# Patient Record
Sex: Male | Born: 1935
Health system: Southern US, Community
[De-identification: ages and names within clinical notes are randomized; demographics above are authoritative.]

## PROBLEM LIST (undated history)

## (undated) DIAGNOSIS — K219 Gastro-esophageal reflux disease without esophagitis: Secondary | ICD-10-CM

## (undated) DIAGNOSIS — E785 Hyperlipidemia, unspecified: Secondary | ICD-10-CM

## (undated) HISTORY — DX: Gastro-esophageal reflux disease without esophagitis: K21.9

---
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
## 1998-06-11 ENCOUNTER — Emergency Department (HOSPITAL_COMMUNITY): Admission: EM | Admit: 1998-06-11 | Discharge: 1998-06-11 | Payer: Self-pay | Admitting: Emergency Medicine

## 2003-09-27 ENCOUNTER — Ambulatory Visit (HOSPITAL_COMMUNITY): Admission: RE | Admit: 2003-09-27 | Discharge: 2003-09-27 | Payer: Self-pay | Admitting: Gastroenterology

## 2011-04-05 ENCOUNTER — Other Ambulatory Visit: Payer: Self-pay | Admitting: Gastroenterology

## 2011-04-05 ENCOUNTER — Ambulatory Visit (HOSPITAL_COMMUNITY)
Admission: RE | Admit: 2011-04-05 | Discharge: 2011-04-05 | Disposition: A | Discharge status: Home / Self Care | Payer: Medicare Other | Source: Ambulatory Visit | Attending: Gastroenterology | Admitting: Gastroenterology

## 2011-04-05 DIAGNOSIS — R112 Nausea with vomiting, unspecified: Secondary | ICD-10-CM | POA: Insufficient documentation

## 2011-04-05 DIAGNOSIS — E538 Deficiency of other specified B group vitamins: Secondary | ICD-10-CM | POA: Insufficient documentation

## 2011-04-05 DIAGNOSIS — R209 Unspecified disturbances of skin sensation: Secondary | ICD-10-CM | POA: Insufficient documentation

## 2011-04-05 DIAGNOSIS — R634 Abnormal weight loss: Secondary | ICD-10-CM | POA: Insufficient documentation

## 2011-04-05 DIAGNOSIS — R630 Anorexia: Secondary | ICD-10-CM | POA: Insufficient documentation

## 2011-04-05 DIAGNOSIS — K2940 Chronic atrophic gastritis without bleeding: Secondary | ICD-10-CM | POA: Insufficient documentation

## 2011-04-12 NOTE — Op Note (Signed)
  NAME:  John Sosa, John Sosa               ACCOUNT NO.:  [id number]  MEDICAL RECORD NO.:  [id number]           PATIENT TYPE:  O  LOCATION:  [facility]                         FACILITY:  [facility]  PHYSICIAN:  Danise Edge, M.D.   DATE OF BIRTH:  Jan 21, 1929  DATE OF PROCEDURE: DATE OF DISCHARGE:                              OPERATIVE REPORT   PROCEDURE:  Diagnostic esophagogastroduodenoscopy.  REFERRING PHYSICIAN:  Theressa Millard, M.D.  HISTORY:  Mr. John Sosa is an 77 year old male, born 1929-02-07.  The patient has not felt well since October 2011.  He reports a loss in his appetite, nausea without vomiting, regurgitation, and weight loss.  He has a tingling sensation in his hands.  He develops small painful ulcers in his mouth that heal.  The patient occasionally takes Celebrex but does not take Excedrin frequently.  He had stomach ulcers when he was a child.  He reports no gastrointestinal bleeding.  He does experience early satiety.  The patient was recently diagnosed with vitamin B12 deficiency.  Esophagogastroduodenoscopy is performed to rule out gastric malignancy, H. pylori gastritis, and celiac disease.  ENDOSCOPIST:  Danise Edge, M.D.  PREMEDICATIONS:  Fentanyl 50 mcg, Versed 4 mg.  PROCEDURE IN DETAIL:  After obtaining informed consent, the patient was placed in the left lateral decubitus position.  The Pentax gastroscope was passed through the posterior hypopharynx into the proximal esophagus without difficulty.  The hypopharynx, larynx and vocal cords appeared normal.  Esophagoscopy:  The proximal mid and lower segments of the esophageal mucosa appear completely normal.  The squamocolumnar junction is noted at 37 cm from the incisor teeth.  There is no endoscopic evidence for the presence of erosive esophagitis or Barrett's esophagus.  Gastroscopy:  Retroflex view of the gastric cardia and fundus was normal.  The diaphragmatic hiatus is patulous.  The gastric  body, antrum and pylorus appear normal.  Biopsies were taken from the gastric antrum, gastric body, and gastric cardia.  Duodenoscopy:  The duodenal bulb and descending duodenum appeared normal.  Biopsies were taken from the second portion of the duodenum and the duodenal bulb.  ASSESSMENT:  Normal esophagogastroduodenoscopy.  Gastric biopsies to look for Helicobacter pylori gastritis and small bowel biopsies to look for villous atrophy are pending.          ______________________________ Danise Edge, M.D.     MJ/MEDQ  D:  04/05/2011  T:  04/05/2011  Job:  [id number]  Electronically Signed by Danise Edge M.D. on 04/12/2011 04:54:09 PM

## 2011-07-10 ENCOUNTER — Other Ambulatory Visit: Payer: Self-pay | Admitting: Gastroenterology

## 2012-01-11 DIAGNOSIS — H3530 Unspecified macular degeneration: Secondary | ICD-10-CM | POA: Diagnosis not present

## 2012-01-11 DIAGNOSIS — H52209 Unspecified astigmatism, unspecified eye: Secondary | ICD-10-CM | POA: Diagnosis not present

## 2012-01-11 DIAGNOSIS — H53009 Unspecified amblyopia, unspecified eye: Secondary | ICD-10-CM | POA: Diagnosis not present

## 2012-01-11 DIAGNOSIS — H43819 Vitreous degeneration, unspecified eye: Secondary | ICD-10-CM | POA: Diagnosis not present

## 2012-04-09 DIAGNOSIS — Z79899 Other long term (current) drug therapy: Secondary | ICD-10-CM | POA: Diagnosis not present

## 2012-04-09 DIAGNOSIS — Z0000 Encounter for general adult medical examination without abnormal findings: Secondary | ICD-10-CM | POA: Diagnosis not present

## 2012-04-09 DIAGNOSIS — E7800 Pure hypercholesterolemia, unspecified: Secondary | ICD-10-CM | POA: Diagnosis not present

## 2012-04-09 DIAGNOSIS — Z1331 Encounter for screening for depression: Secondary | ICD-10-CM | POA: Diagnosis not present

## 2012-04-22 DIAGNOSIS — J029 Acute pharyngitis, unspecified: Secondary | ICD-10-CM | POA: Diagnosis not present

## 2012-06-06 DIAGNOSIS — R972 Elevated prostate specific antigen [PSA]: Secondary | ICD-10-CM | POA: Diagnosis not present

## 2012-06-06 DIAGNOSIS — N529 Male erectile dysfunction, unspecified: Secondary | ICD-10-CM | POA: Diagnosis not present

## 2012-06-06 DIAGNOSIS — N400 Enlarged prostate without lower urinary tract symptoms: Secondary | ICD-10-CM | POA: Diagnosis not present

## 2012-06-25 DIAGNOSIS — E7800 Pure hypercholesterolemia, unspecified: Secondary | ICD-10-CM | POA: Diagnosis not present

## 2012-06-25 DIAGNOSIS — Z79899 Other long term (current) drug therapy: Secondary | ICD-10-CM | POA: Diagnosis not present

## 2012-10-25 DIAGNOSIS — Z23 Encounter for immunization: Secondary | ICD-10-CM | POA: Diagnosis not present

## 2013-01-20 DIAGNOSIS — Z961 Presence of intraocular lens: Secondary | ICD-10-CM | POA: Diagnosis not present

## 2013-01-20 DIAGNOSIS — H43819 Vitreous degeneration, unspecified eye: Secondary | ICD-10-CM | POA: Diagnosis not present

## 2013-01-20 DIAGNOSIS — H53009 Unspecified amblyopia, unspecified eye: Secondary | ICD-10-CM | POA: Diagnosis not present

## 2013-01-20 DIAGNOSIS — H3530 Unspecified macular degeneration: Secondary | ICD-10-CM | POA: Diagnosis not present

## 2013-04-13 DIAGNOSIS — Z79899 Other long term (current) drug therapy: Secondary | ICD-10-CM | POA: Diagnosis not present

## 2013-04-13 DIAGNOSIS — E538 Deficiency of other specified B group vitamins: Secondary | ICD-10-CM | POA: Diagnosis not present

## 2013-04-13 DIAGNOSIS — E7800 Pure hypercholesterolemia, unspecified: Secondary | ICD-10-CM | POA: Diagnosis not present

## 2013-04-13 DIAGNOSIS — Z0000 Encounter for general adult medical examination without abnormal findings: Secondary | ICD-10-CM | POA: Diagnosis not present

## 2013-04-13 DIAGNOSIS — Z1331 Encounter for screening for depression: Secondary | ICD-10-CM | POA: Diagnosis not present

## 2013-04-13 DIAGNOSIS — G4733 Obstructive sleep apnea (adult) (pediatric): Secondary | ICD-10-CM | POA: Diagnosis not present

## 2013-04-13 DIAGNOSIS — G43009 Migraine without aura, not intractable, without status migrainosus: Secondary | ICD-10-CM | POA: Diagnosis not present

## 2013-04-13 DIAGNOSIS — Z86010 Personal history of colonic polyps: Secondary | ICD-10-CM | POA: Diagnosis not present

## 2013-04-20 DIAGNOSIS — T148 Other injury of unspecified body region: Secondary | ICD-10-CM | POA: Diagnosis not present

## 2013-06-12 DIAGNOSIS — R972 Elevated prostate specific antigen [PSA]: Secondary | ICD-10-CM | POA: Diagnosis not present

## 2013-06-12 DIAGNOSIS — N400 Enlarged prostate without lower urinary tract symptoms: Secondary | ICD-10-CM | POA: Diagnosis not present

## 2013-08-06 DIAGNOSIS — Z23 Encounter for immunization: Secondary | ICD-10-CM | POA: Diagnosis not present

## 2013-11-10 DIAGNOSIS — R1032 Left lower quadrant pain: Secondary | ICD-10-CM | POA: Diagnosis not present

## 2013-11-19 DIAGNOSIS — J069 Acute upper respiratory infection, unspecified: Secondary | ICD-10-CM | POA: Diagnosis not present

## 2013-11-19 DIAGNOSIS — J209 Acute bronchitis, unspecified: Secondary | ICD-10-CM | POA: Diagnosis not present

## 2013-11-20 ENCOUNTER — Emergency Department (HOSPITAL_COMMUNITY)
Admission: EM | Admit: 2013-11-20 | Discharge: 2013-11-20 | Disposition: A | Discharge status: Home / Self Care | Payer: Medicare Other | Attending: Emergency Medicine | Admitting: Emergency Medicine

## 2013-11-20 ENCOUNTER — Encounter (HOSPITAL_COMMUNITY): Payer: Self-pay | Admitting: Emergency Medicine

## 2013-11-20 ENCOUNTER — Emergency Department (HOSPITAL_COMMUNITY): Payer: Medicare Other

## 2013-11-20 DIAGNOSIS — E785 Hyperlipidemia, unspecified: Secondary | ICD-10-CM | POA: Insufficient documentation

## 2013-11-20 DIAGNOSIS — R059 Cough, unspecified: Secondary | ICD-10-CM | POA: Insufficient documentation

## 2013-11-20 DIAGNOSIS — Z87891 Personal history of nicotine dependence: Secondary | ICD-10-CM | POA: Insufficient documentation

## 2013-11-20 DIAGNOSIS — R0789 Other chest pain: Secondary | ICD-10-CM | POA: Insufficient documentation

## 2013-11-20 DIAGNOSIS — Z79899 Other long term (current) drug therapy: Secondary | ICD-10-CM | POA: Diagnosis not present

## 2013-11-20 DIAGNOSIS — R079 Chest pain, unspecified: Secondary | ICD-10-CM

## 2013-11-20 DIAGNOSIS — R05 Cough: Secondary | ICD-10-CM | POA: Insufficient documentation

## 2013-11-20 DIAGNOSIS — R5381 Other malaise: Secondary | ICD-10-CM | POA: Insufficient documentation

## 2013-11-20 DIAGNOSIS — R5383 Other fatigue: Secondary | ICD-10-CM

## 2013-11-20 HISTORY — DX: Hyperlipidemia, unspecified: E78.5

## 2013-11-20 LAB — CBC
HCT: 39.1 % (ref 39.0–52.0)
Hemoglobin: 13.3 g/dL (ref 13.0–17.0)
MCH: 29.1 pg (ref 26.0–34.0)
MCHC: 34 g/dL (ref 30.0–36.0)
MCV: 85.6 fL (ref 78.0–100.0)
Platelets: 123 10*3/uL — ABNORMAL LOW (ref 150–400)
RBC: 4.57 MIL/uL (ref 4.22–5.81)
RDW: 13.1 % (ref 11.5–15.5)
WBC: 5.2 10*3/uL (ref 4.0–10.5)

## 2013-11-20 LAB — BASIC METABOLIC PANEL
BUN: 13 mg/dL (ref 6–23)
CALCIUM: 8.9 mg/dL (ref 8.4–10.5)
CO2: 23 mEq/L (ref 19–32)
Chloride: 101 mEq/L (ref 96–112)
Creatinine, Ser: 0.8 mg/dL (ref 0.50–1.35)
GFR, EST NON AFRICAN AMERICAN: 81 mL/min — AB (ref >90)
GLUCOSE: 98 mg/dL (ref 70–99)
Potassium: 4 mEq/L (ref 3.7–5.3)
Sodium: 138 mEq/L (ref 137–147)

## 2013-11-20 LAB — POCT I-STAT TROPONIN I
TROPONIN I, POC: 0 ng/mL (ref 0.00–0.08)
Troponin i, poc: 0 ng/mL (ref 0.00–0.08)
Troponin i, poc: 0 ng/mL (ref 0.00–0.08)

## 2013-11-20 LAB — POCT I-STAT, CHEM 8
BUN: 18 mg/dL (ref 6–23)
CHLORIDE: 105 meq/L (ref 96–112)
Calcium, Ion: 1.22 mmol/L (ref 1.13–1.30)
Creatinine, Ser: 0.8 mg/dL (ref 0.50–1.35)
GLUCOSE: 80 mg/dL (ref 70–99)
HEMATOCRIT: 39 % (ref 39.0–52.0)
HEMOGLOBIN: 13.3 g/dL (ref 13.0–17.0)
Potassium: 4.3 mEq/L (ref 3.7–5.3)
Sodium: 142 mEq/L (ref 137–147)
TCO2: 26 mmol/L (ref 0–100)

## 2013-11-20 MED ORDER — ACETAMINOPHEN 325 MG PO TABS
650.0000 mg | ORAL_TABLET | Freq: Once | ORAL | Status: AC
  Administered 2013-11-20: 650 mg via ORAL
  Filled 2013-11-20: qty 2

## 2013-11-20 MED ORDER — DEXTROMETHORPHAN-GUAIFENESIN 10-200 MG/5ML PO LIQD: 10.0000 mL | Freq: Four times a day (QID) | ORAL | Status: DC | PRN

## 2013-11-20 MED ORDER — TRAMADOL HCL 50 MG PO TABS: 50.0000 mg | ORAL_TABLET | Freq: Four times a day (QID) | ORAL | Status: DC | PRN

## 2013-11-20 NOTE — Discharge Instructions (Signed)
Chest Pain (Nonspecific) °It is often hard to give a specific diagnosis for the cause of chest pain. There is always a chance that your pain could be related to something serious, such as a heart attack or a blood clot in the lungs. You need to follow up with your caregiver for further evaluation. °CAUSES  °· Heartburn. °· Pneumonia or bronchitis. °· Anxiety or stress. °· Inflammation around your heart (pericarditis) or lung (pleuritis or pleurisy). °· A blood clot in the lung. °· A collapsed lung (pneumothorax). It can develop suddenly on its own (spontaneous pneumothorax) or from injury (trauma) to the chest. °· Shingles infection (herpes zoster virus). °The chest wall is composed of bones, muscles, and cartilage. Any of these can be the source of the pain. °· The bones can be bruised by injury. °· The muscles or cartilage can be strained by coughing or overwork. °· The cartilage can be affected by inflammation and become sore (costochondritis). °DIAGNOSIS  °Lab tests or other studies, such as X-rays, electrocardiography, stress testing, or cardiac imaging, may be needed to find the cause of your pain.  °TREATMENT  °· Treatment depends on what may be causing your chest pain. Treatment may include: °· Acid blockers for heartburn. °· Anti-inflammatory medicine. °· Pain medicine for inflammatory conditions. °· Antibiotics if an infection is present. °· You may be advised to change lifestyle habits. This includes stopping smoking and avoiding alcohol, caffeine, and chocolate. °· You may be advised to keep your head raised (elevated) when sleeping. This reduces the chance of acid going backward from your stomach into your esophagus. °· Most of the time, nonspecific chest pain will improve within 2 to 3 days with rest and mild pain medicine. °HOME CARE INSTRUCTIONS  °· If antibiotics were prescribed, take your antibiotics as directed. Finish them even if you start to feel better. °· For the next few days, avoid physical  activities that bring on chest pain. Continue physical activities as directed. °· Do not smoke. °· Avoid drinking alcohol. °· Only take over-the-counter or prescription medicine for pain, discomfort, or fever as directed by your caregiver. °· Follow your caregiver's suggestions for further testing if your chest pain does not go away. °· Keep any follow-up appointments you made. If you do not go to an appointment, you could develop lasting (chronic) problems with pain. If there is any problem keeping an appointment, you must call to reschedule. °SEEK MEDICAL CARE IF:  °· You think you are having problems from the medicine you are taking. Read your medicine instructions carefully. °· Your chest pain does not go away, even after treatment. °· You develop a rash with blisters on your chest. °SEEK IMMEDIATE MEDICAL CARE IF:  °· You have increased chest pain or pain that spreads to your arm, neck, jaw, back, or abdomen. °· You develop shortness of breath, an increasing cough, or you are coughing up blood. °· You have severe back or abdominal pain, feel nauseous, or vomit. °· You develop severe weakness, fainting, or chills. °· You have a fever. °THIS IS AN EMERGENCY. Do not wait to see if the pain will go away. Get medical help at once. Call your local emergency services (911 in [place] Do not drive yourself to the hospital. °MAKE SURE YOU:  °· Understand these instructions. °· Will watch your condition. °· Will get help right away if you are not doing well or get worse. °Document Released: 08/08/2005 Document Revised: 01/21/2012 Document Reviewed: 06/03/2008 °Exit[employer]® Patient Information ©2014 [employer]   [employer]. ° °

## 2013-11-20 NOTE — ED Notes (Signed)
Discharge instructions reviewed with pt and family. Pt verbalized understanding.  

## 2013-11-20 NOTE — ED Notes (Signed)
Pt returned from radiology.

## 2013-11-20 NOTE — ED Provider Notes (Signed)
CSN: [id number]     Arrival date & time 11/20/13  0350 History   First MD Initiated Contact with Patient 11/20/13 [phone]     Chief Complaint  Patient presents with  . Chest Pain   (Consider location/radiation/quality/duration/timing/severity/associated sxs/prior Treatment) Patient is a 80 y.o. male presenting with chest pain. The history is provided by the patient.  Chest Pain Associated symptoms: cough and fatigue   Associated symptoms: no abdominal pain, no back pain, no headache, no nausea, no numbness, no shortness of breath, not vomiting and no weakness    patient with pain in both arms today. He states it feels as if it is in his bones. He states he's had a cough and "the flu" over the last few days. He states he's had episodes of chest tightness. He's had a cough with some sputum production. No fevers. He states he's felt fatigued. He is a former smoker. No abdominal pain. No lightheadedness. He's had some fatigue. No trauma. No diaphoresis. No nausea.  Past Medical History  Diagnosis Date  . Hyperlipemia    History reviewed. No pertinent past surgical history. No family history on file. History  Substance Use Topics  . Smoking status: Former Research scientist (life sciences)  . Smokeless tobacco: Not on file  . Alcohol Use: No    Review of Systems  Constitutional: Positive for fatigue. Negative for activity change and appetite change.  Eyes: Negative for pain.  Respiratory: Positive for cough. Negative for chest tightness and shortness of breath.   Cardiovascular: Positive for chest pain. Negative for leg swelling.  Gastrointestinal: Negative for nausea, vomiting, abdominal pain and diarrhea.  Genitourinary: Negative for flank pain.  Musculoskeletal: Negative for back pain and neck stiffness.  Skin: Negative for rash.  Neurological: Negative for weakness, numbness and headaches.  Psychiatric/Behavioral: Negative for behavioral problems.    Allergies  Review of patient's allergies indicates no  known allergies.  Home Medications   Current Outpatient Rx  Name  Route  Sig  Dispense  Refill  . Cyanocobalamin (VITAMIN B-12 IJ)   Injection   Inject 1 mL as directed every 30 (thirty) days.         . Dextromethorphan-Guaifenesin (MUCINEX FAST-MAX DM MAX) 5-100 MG/5ML LIQD   Oral   Take 20 mLs by mouth every 6 (six) hours as needed (cough).         . Multiple Vitamins-Minerals (ICAPS MV) TABS   Oral   Take 1 tablet by mouth daily.         . simvastatin (ZOCOR) 10 MG tablet   Oral   Take 10 mg by mouth daily.         Marland Kitchen sulfamethoxazole-trimethoprim (BACTRIM DS) 800-160 MG per tablet   Oral   Take 1 tablet by mouth 2 (two) times daily.         Marland Kitchen Dextromethorphan-Guaifenesin (TUSSIN DM MAX) 10-200 MG/5ML LIQD   Oral   Take 10 mLs by mouth every 6 (six) hours as needed (cough).   118 mL   0   . traMADol (ULTRAM) 50 MG tablet   Oral   Take 1 tablet (50 mg total) by mouth every 6 (six) hours as needed.   15 tablet   0    BP 133/84  Pulse 54  Temp(Src) 98.7 F (37.1 C) (Oral)  Resp 12  SpO2 99% Physical Exam  Nursing note and vitals reviewed. Constitutional: He is oriented to person, place, and time. He appears well-developed and well-nourished.  HENT:  Head: Normocephalic and  atraumatic.  Eyes: EOM are normal. Pupils are equal, round, and reactive to light.  Neck: Normal range of motion. Neck supple.  Cardiovascular: Normal rate, regular rhythm and normal heart sounds.   No murmur heard. Pulmonary/Chest: Effort normal and breath sounds normal.  Abdominal: Soft. Bowel sounds are normal. He exhibits no distension and no mass. There is no tenderness. There is no rebound and no guarding.  Musculoskeletal: Normal range of motion. He exhibits no edema.  Neurological: He is alert and oriented to person, place, and time. No cranial nerve deficit.  Skin: Skin is warm and dry.  Psychiatric: He has a normal mood and affect.    ED Course  Procedures (including  critical care time) Labs Review Labs Reviewed  CBC - Abnormal; Notable for the following:    Platelets 123 (*)    All other components within normal limits  BASIC METABOLIC PANEL - Abnormal; Notable for the following:    GFR calc non Af Amer 81 (*)    All other components within normal limits  POCT I-STAT, CHEM 8  POCT I-STAT TROPONIN I  POCT I-STAT TROPONIN I  POCT I-STAT TROPONIN I   Imaging Review Dg Chest 2 View  11/20/2013   CLINICAL DATA:  Chest tightness, pain  EXAM: CHEST  2 VIEW  COMPARISON:  None.  FINDINGS: The lungs are hyperinflated likely secondary to COPD. There is no focal parenchymal opacity, pleural effusion, or pneumothorax. The heart and mediastinal contours are unremarkable.  There is a patchy sclerotic lesion in the proximal left humeral metaphysis with a chondroid matrix most consistent with a benign chondroid lesion such as an enchondroma.  IMPRESSION: No active cardiopulmonary disease.   Electronically Signed   By: Kathreen Devoid   On: 11/20/2013 09:42    EKG Interpretation    Date/Time:  Friday November 20 2013 08:56:19 EST Ventricular Rate:  54 PR Interval:  160 QRS Duration: 94 QT Interval:  425 QTC Calculation: 403 R Axis:   46 Text Interpretation:  Sinus rhythm Baseline wander in lead(s) V5 Confirmed by Tymika Grilli  MD, Shiro Ellerman ([id number]) on 11/20/2013 12:23:05 PM            MDM   1. Chest pain    Patient with chest pain. EKG reassuring. Enzymes negative x2. X-ray reassuring. Nonfocal exam. Discharge home    Jasper Riling. Alvino Chapel, MD 11/20/13 [phone]

## 2013-11-20 NOTE — ED Notes (Signed)
Dr Pickering at bedside 

## 2013-11-20 NOTE — ED Notes (Signed)
Per EMS - pt c/o chest tightness X 5 days, yesterday morning the tightness was worse, along with bilateral arm pain. Pt rating pain at 2/10. Recent chest congestion, lung sounds clear. 12 lead NSR. Pt took 325 mg of ASA PTA. ems started 18 G IV and placed on 2 liters [place]. BP 178/79. HR 61.

## 2013-11-24 DIAGNOSIS — M2550 Pain in unspecified joint: Secondary | ICD-10-CM | POA: Diagnosis not present

## 2013-11-24 DIAGNOSIS — J069 Acute upper respiratory infection, unspecified: Secondary | ICD-10-CM | POA: Diagnosis not present

## 2013-11-24 DIAGNOSIS — IMO0001 Reserved for inherently not codable concepts without codable children: Secondary | ICD-10-CM | POA: Diagnosis not present

## 2014-04-14 DIAGNOSIS — Z79899 Other long term (current) drug therapy: Secondary | ICD-10-CM | POA: Diagnosis not present

## 2014-04-14 DIAGNOSIS — Z23 Encounter for immunization: Secondary | ICD-10-CM | POA: Diagnosis not present

## 2014-04-14 DIAGNOSIS — J309 Allergic rhinitis, unspecified: Secondary | ICD-10-CM | POA: Diagnosis not present

## 2014-04-14 DIAGNOSIS — Z86010 Personal history of colonic polyps: Secondary | ICD-10-CM | POA: Diagnosis not present

## 2014-04-14 DIAGNOSIS — Z0000 Encounter for general adult medical examination without abnormal findings: Secondary | ICD-10-CM | POA: Diagnosis not present

## 2014-04-14 DIAGNOSIS — G43009 Migraine without aura, not intractable, without status migrainosus: Secondary | ICD-10-CM | POA: Diagnosis not present

## 2014-04-14 DIAGNOSIS — Z1331 Encounter for screening for depression: Secondary | ICD-10-CM | POA: Diagnosis not present

## 2014-04-14 DIAGNOSIS — E7800 Pure hypercholesterolemia, unspecified: Secondary | ICD-10-CM | POA: Diagnosis not present

## 2014-04-14 DIAGNOSIS — E538 Deficiency of other specified B group vitamins: Secondary | ICD-10-CM | POA: Diagnosis not present

## 2014-04-14 DIAGNOSIS — G4733 Obstructive sleep apnea (adult) (pediatric): Secondary | ICD-10-CM | POA: Diagnosis not present

## 2014-06-18 DIAGNOSIS — R972 Elevated prostate specific antigen [PSA]: Secondary | ICD-10-CM | POA: Diagnosis not present

## 2014-06-18 DIAGNOSIS — N400 Enlarged prostate without lower urinary tract symptoms: Secondary | ICD-10-CM | POA: Diagnosis not present

## 2014-06-18 DIAGNOSIS — N529 Male erectile dysfunction, unspecified: Secondary | ICD-10-CM | POA: Diagnosis not present

## 2014-07-14 ENCOUNTER — Ambulatory Visit
Admission: RE | Admit: 2014-07-14 | Discharge: 2014-07-14 | Disposition: A | Discharge status: Home / Self Care | Payer: Medicare Other | Source: Ambulatory Visit | Attending: Internal Medicine | Admitting: Internal Medicine

## 2014-07-14 ENCOUNTER — Other Ambulatory Visit: Payer: Self-pay | Admitting: Internal Medicine

## 2014-07-14 DIAGNOSIS — IMO0002 Reserved for concepts with insufficient information to code with codable children: Secondary | ICD-10-CM | POA: Diagnosis not present

## 2014-07-14 DIAGNOSIS — M533 Sacrococcygeal disorders, not elsewhere classified: Secondary | ICD-10-CM | POA: Diagnosis not present

## 2014-07-14 DIAGNOSIS — W1809XA Striking against other object with subsequent fall, initial encounter: Secondary | ICD-10-CM

## 2014-08-05 DIAGNOSIS — Z23 Encounter for immunization: Secondary | ICD-10-CM | POA: Diagnosis not present

## 2014-09-13 DIAGNOSIS — H43813 Vitreous degeneration, bilateral: Secondary | ICD-10-CM | POA: Diagnosis not present

## 2014-09-13 DIAGNOSIS — H3531 Nonexudative age-related macular degeneration: Secondary | ICD-10-CM | POA: Diagnosis not present

## 2014-09-13 DIAGNOSIS — H53001 Unspecified amblyopia, right eye: Secondary | ICD-10-CM | POA: Diagnosis not present

## 2014-09-13 DIAGNOSIS — H52203 Unspecified astigmatism, bilateral: Secondary | ICD-10-CM | POA: Diagnosis not present

## 2015-04-20 DIAGNOSIS — Z1389 Encounter for screening for other disorder: Secondary | ICD-10-CM | POA: Diagnosis not present

## 2015-04-20 DIAGNOSIS — E780 Pure hypercholesterolemia: Secondary | ICD-10-CM | POA: Diagnosis not present

## 2015-04-20 DIAGNOSIS — Z0000 Encounter for general adult medical examination without abnormal findings: Secondary | ICD-10-CM | POA: Diagnosis not present

## 2015-04-20 DIAGNOSIS — G4733 Obstructive sleep apnea (adult) (pediatric): Secondary | ICD-10-CM | POA: Diagnosis not present

## 2015-04-20 DIAGNOSIS — E538 Deficiency of other specified B group vitamins: Secondary | ICD-10-CM | POA: Diagnosis not present

## 2015-07-06 DIAGNOSIS — N400 Enlarged prostate without lower urinary tract symptoms: Secondary | ICD-10-CM | POA: Diagnosis not present

## 2015-07-06 DIAGNOSIS — R972 Elevated prostate specific antigen [PSA]: Secondary | ICD-10-CM | POA: Diagnosis not present

## 2015-07-13 ENCOUNTER — Encounter (HOSPITAL_COMMUNITY): Payer: Self-pay | Admitting: *Deleted

## 2015-07-13 ENCOUNTER — Emergency Department (HOSPITAL_COMMUNITY): Payer: Medicare Other

## 2015-07-13 ENCOUNTER — Observation Stay (HOSPITAL_COMMUNITY)
Admission: EM | Admit: 2015-07-13 | Discharge: 2015-07-14 | Disposition: A | Discharge status: Home / Self Care | Payer: Medicare Other | Attending: Internal Medicine | Admitting: Internal Medicine

## 2015-07-13 DIAGNOSIS — Z792 Long term (current) use of antibiotics: Secondary | ICD-10-CM | POA: Diagnosis not present

## 2015-07-13 DIAGNOSIS — R079 Chest pain, unspecified: Principal | ICD-10-CM | POA: Diagnosis present

## 2015-07-13 DIAGNOSIS — K219 Gastro-esophageal reflux disease without esophagitis: Secondary | ICD-10-CM | POA: Diagnosis present

## 2015-07-13 DIAGNOSIS — Z79899 Other long term (current) drug therapy: Secondary | ICD-10-CM | POA: Diagnosis not present

## 2015-07-13 DIAGNOSIS — R0789 Other chest pain: Secondary | ICD-10-CM | POA: Diagnosis not present

## 2015-07-13 DIAGNOSIS — J449 Chronic obstructive pulmonary disease, unspecified: Secondary | ICD-10-CM | POA: Diagnosis not present

## 2015-07-13 DIAGNOSIS — N400 Enlarged prostate without lower urinary tract symptoms: Secondary | ICD-10-CM | POA: Diagnosis present

## 2015-07-13 DIAGNOSIS — E785 Hyperlipidemia, unspecified: Secondary | ICD-10-CM | POA: Diagnosis not present

## 2015-07-13 DIAGNOSIS — R110 Nausea: Secondary | ICD-10-CM | POA: Diagnosis not present

## 2015-07-13 DIAGNOSIS — R001 Bradycardia, unspecified: Secondary | ICD-10-CM | POA: Diagnosis not present

## 2015-07-13 DIAGNOSIS — Z87891 Personal history of nicotine dependence: Secondary | ICD-10-CM | POA: Insufficient documentation

## 2015-07-13 HISTORY — DX: Benign prostatic hyperplasia without lower urinary tract symptoms: N40.0

## 2015-07-13 LAB — BASIC METABOLIC PANEL
Anion gap: 7 (ref 5–15)
BUN: 20 mg/dL (ref 6–20)
CO2: 24 mmol/L (ref 22–32)
CREATININE: 0.92 mg/dL (ref 0.61–1.24)
Calcium: 8.8 mg/dL — ABNORMAL LOW (ref 8.9–10.3)
Chloride: 108 mmol/L (ref 101–111)
GFR calc Af Amer: >60 mL/min (ref >60)
GFR calc non Af Amer: >60 mL/min (ref >60)
Glucose, Bld: 92 mg/dL (ref 65–99)
Potassium: 4.1 mmol/L (ref 3.5–5.1)
Sodium: 139 mmol/L (ref 135–145)

## 2015-07-13 LAB — CBC
HCT: 39.5 % (ref 39.0–52.0)
HEMOGLOBIN: 13.3 g/dL (ref 13.0–17.0)
MCH: 30 pg (ref 26.0–34.0)
MCHC: 33.7 g/dL (ref 30.0–36.0)
MCV: 89 fL (ref 78.0–100.0)
Platelets: 139 10*3/uL — ABNORMAL LOW (ref 150–400)
RBC: 4.44 MIL/uL (ref 4.22–5.81)
RDW: 13.1 % (ref 11.5–15.5)
WBC: 8.8 10*3/uL (ref 4.0–10.5)

## 2015-07-13 LAB — I-STAT TROPONIN, ED: TROPONIN I, POC: 0.01 ng/mL (ref 0.00–0.08)

## 2015-07-13 LAB — TROPONIN I: Troponin I: <0.03 ng/mL (ref <0.031)

## 2015-07-13 MED ORDER — GI COCKTAIL ~~LOC~~: 30.0000 mL | Freq: Four times a day (QID) | ORAL | Status: DC | PRN

## 2015-07-13 MED ORDER — MORPHINE SULFATE (PF) 2 MG/ML IV SOLN: 2.0000 mg | INTRAVENOUS | Status: DC | PRN

## 2015-07-13 MED ORDER — CELECOXIB 200 MG PO CAPS
200.0000 mg | ORAL_CAPSULE | Freq: Every day | ORAL | Status: DC | PRN
  Filled 2015-07-13: qty 1

## 2015-07-13 MED ORDER — FINASTERIDE 5 MG PO TABS
5.0000 mg | ORAL_TABLET | Freq: Every day | ORAL | Status: DC
  Administered 2015-07-13: 5 mg via ORAL
  Filled 2015-07-13: qty 1

## 2015-07-13 MED ORDER — ACETAMINOPHEN 325 MG PO TABS: 650.0000 mg | ORAL_TABLET | ORAL | Status: DC | PRN

## 2015-07-13 MED ORDER — ASPIRIN EC 325 MG PO TBEC
325.0000 mg | DELAYED_RELEASE_TABLET | Freq: Every day | ORAL | Status: DC
  Administered 2015-07-14: 325 mg via ORAL
  Filled 2015-07-13 (×2): qty 1

## 2015-07-13 MED ORDER — SIMVASTATIN 10 MG PO TABS: 10.0000 mg | ORAL_TABLET | Freq: Every day | ORAL | Status: DC

## 2015-07-13 MED ORDER — ONDANSETRON HCL 4 MG/2ML IJ SOLN: 4.0000 mg | Freq: Four times a day (QID) | INTRAMUSCULAR | Status: DC | PRN

## 2015-07-13 MED ORDER — VITAMIN D 1000 UNITS PO TABS
1000.0000 [IU] | ORAL_TABLET | Freq: Every day | ORAL | Status: DC
  Administered 2015-07-14: 1000 [IU] via ORAL
  Filled 2015-07-13: qty 1

## 2015-07-13 NOTE — ED Notes (Signed)
Pt reported chest pain and indigestion. Pt called EMS. Pt received mylanta, 350mg  asprin and 1 nitro. Pt states that the mylanta seemed to help. Pt was reported to have a HR of 48. Pt denies pain at this time.

## 2015-07-13 NOTE — H&P (Signed)
PCP:   Horton Finer, MD   Chief Complaint:  Chest pain  HPI: 81 yo male healthy lives at home with wife around 3pm today started having lefts sided chest pain that felt like his heartburn.  Pt reports his "heartburn" always is the left chest not epigastric, but this time it was more severe and lasted longer.  He took aspirin, his wifes ntg tablet and maalox.  His pain resolved by the time he arrived the ED.  Lasted about 30 minutes and was associated with nausea.  Has had stress testing done years ago, they dont remember when.  Wife provides some history also.  No recent illnesses.  No fevers, no cough or swelling in his legs.  He is referred for admission for evaluation of his chest pain.  Review of Systems:  Positive and negative as per HPI otherwise all other systems are negative  Past Medical History: Past Medical History  Diagnosis Date  . Hyperlipemia    History reviewed. No pertinent past surgical history.  Medications: Prior to Admission medications   Medication Sig Start Date End Date Taking? Authorizing Provider  celecoxib (CELEBREX) 200 MG capsule Take 200 mg by mouth daily as needed for mild pain or moderate pain.  06/08/15  Yes Historical Provider, MD  cholecalciferol (VITAMIN D) 1000 UNITS tablet Take 1,000 Units by mouth daily.   Yes Historical Provider, MD  Cyanocobalamin (VITAMIN B-12 IJ) Inject 1 mL as directed every 30 (thirty) days.   Yes Historical Provider, MD  finasteride (PROSCAR) 5 MG tablet Take 5 mg by mouth at bedtime. 07/06/15  Yes Historical Provider, MD  simvastatin (ZOCOR) 10 MG tablet Take 10 mg by mouth daily.   Yes Historical Provider, MD  Dextromethorphan-Guaifenesin (TUSSIN DM MAX) 10-200 MG/5ML LIQD Take 10 mLs by mouth every 6 (six) hours as needed (cough). Patient not taking: Reported on 07/13/2015 11/20/13   Davonna Belling, MD  traMADol (ULTRAM) 50 MG tablet Take 1 tablet (50 mg total) by mouth every 6 (six) hours as needed. Patient not  taking: Reported on 07/13/2015 11/20/13   Davonna Belling, MD    Allergies:  No Known Allergies  Social History:  reports that he has quit smoking. He does not have any smokeless tobacco history on file. He reports that he does not drink alcohol or use illicit drugs.  Family History: No premature CAD  Physical Exam: Filed Vitals:   07/13/15 1930 07/13/15 1945 07/13/15 2000 07/13/15 2015  BP: 144/78 146/88 157/79 145/63  Pulse: 58 49 51 47  Temp:      TempSrc:      Resp: 13 16 17 17   Height:      Weight:      SpO2: 91% 99% 99% 100%   General appearance: alert, cooperative and no distress Head: Normocephalic, without obvious abnormality, atraumatic Eyes: negative Nose: Nares normal. Septum midline. Mucosa normal. No drainage or sinus tenderness. Neck: no JVD and supple, symmetrical, trachea midline Lungs: clear to auscultation bilaterally Heart: regular rate and rhythm, S1, S2 normal, no murmur, click, rub or gallop Abdomen: soft, non-tender; bowel sounds normal; no masses,  no organomegaly Extremities: extremities normal, atraumatic, no cyanosis or edema Pulses: 2+ and symmetric Skin: Skin color, texture, turgor normal. No rashes or lesions Neurologic: Grossly normal  Labs on Admission:   Recent Labs  07/13/15 1712  NA 139  K 4.1  CL 108  CO2 24  GLUCOSE 92  BUN 20  CREATININE 0.92  CALCIUM 8.8*    Recent  Labs  07/13/15 1712  WBC 8.8  HGB 13.3  HCT 39.5  MCV 89.0  PLT 139*   ekg reveiwed sinus brady no ishchemic changes cxr rewieved and no edema or infiltrate Case discussed with jen ED provider   Radiological Exams on Admission: Dg Chest 2 View  07/13/2015   CLINICAL DATA:  Left-sided chest pain began this afternoon.  EXAM: CHEST  2 VIEW  COMPARISON:  11/20/2013  FINDINGS: The lungs are hyperinflated likely secondary to COPD. There is no focal parenchymal opacity. There is no pleural effusion or pneumothorax. The heart and mediastinal contours are  unremarkable.  The osseous structures are unremarkable.  IMPRESSION: No active cardiopulmonary disease.   Electronically Signed   By: Kathreen Devoid   On: 07/13/2015 17:37    Assessment/Plan  81 yo male with h/o HLD, GERD, BPH here with atypical chest pain  Principal Problem:   Chest pain-  Unclear etiology.  Sounds atypical for GERD and more concerning for cardiac etiology.  Chest pain free at this time.  obs on tele.  Serial cardiac enzymes and check cardiac echo in am.  His wife set him up to see her cardiologist on sept 8, they have appt for this already as outpatient.  Place on daily aspirin.  Pt instructed to let us know if pain recurs, if so consider adding ntg paste to regimen.  Initial trop neg and ekg nonischemic at this time.  Active Problems:   Hyperlipemia-  Cont home zocor   GERD (gastroesophageal reflux disease)- noted   BPH (benign prostatic hypertrophy)- cont home med   Sinus bradycardia on ECG- noted, mild in 50s.  Echo in am.  Tele monitoring.  Hold bblocker at this time  obs on tele.  Full code.    DAVID,RACHAL A 07/13/2015, 8:35 PM

## 2015-07-13 NOTE — ED Provider Notes (Signed)
CSN: [id number]     Arrival date & time 07/13/15  1702 History   First MD Initiated Contact with Patient 07/13/15 1918     Chief Complaint  Patient presents with  . Chest Pain     (Consider location/radiation/quality/duration/timing/severity/associated sxs/prior Treatment) HPI Comments: Patient is an 81 yo M PMHx significant for HLD presenting to the ED for evaluation of left sided chest pain that began at 3PM this afternoon. Per family he was pale and nauseated. The wife gave him one of her SL nitro and Mylanta with resolution of his pain. He has never had a stress test, echo, or cardiac cath. Due to see cardiology for first visit 07/22/15.   Patient is a 81 y.o. male presenting with chest pain. The history is provided by the patient and a relative.  Chest Pain Pain location:  L chest Pain quality: pressure and tightness   Pain radiates to:  Does not radiate Onset quality:  Sudden Duration:  15 minutes Progression:  Resolved Chronicity:  New Relieved by:  Nitroglycerin Worsened by:  Nothing tried Ineffective treatments:  None tried Associated symptoms: nausea   Associated symptoms: no abdominal pain and no shortness of breath   Risk factors: high cholesterol and male sex     Past Medical History  Diagnosis Date  . Hyperlipemia    History reviewed. No pertinent past surgical history. No family history on file. Social History  Substance Use Topics  . Smoking status: Former Research scientist (life sciences)  . Smokeless tobacco: None  . Alcohol Use: No    Review of Systems  Respiratory: Negative for shortness of breath.   Cardiovascular: Positive for chest pain.  Gastrointestinal: Positive for nausea. Negative for abdominal pain.  All other systems reviewed and are negative.     Allergies  Review of patient's allergies indicates no known allergies.  Home Medications   Prior to Admission medications   Medication Sig Start Date End Date Taking? Authorizing Provider  Cyanocobalamin (VITAMIN  B-12 IJ) Inject 1 mL as directed every 30 (thirty) days.    Historical Provider, MD  Dextromethorphan-Guaifenesin ([place] FAST-MAX DM MAX) 5-100 MG/5ML LIQD Take 20 mLs by mouth every 6 (six) hours as needed (cough).    Historical Provider, MD  Dextromethorphan-Guaifenesin (TUSSIN DM MAX) 10-200 MG/5ML LIQD Take 10 mLs by mouth every 6 (six) hours as needed (cough). 11/20/13   Davonna Belling, MD  Multiple Vitamins-Minerals (ICAPS MV) TABS Take 1 tablet by mouth daily.    Historical Provider, MD  simvastatin (ZOCOR) 10 MG tablet Take 10 mg by mouth daily.    Historical Provider, MD  sulfamethoxazole-trimethoprim (BACTRIM DS) 800-160 MG per tablet Take 1 tablet by mouth 2 (two) times daily.    Historical Provider, MD  traMADol (ULTRAM) 50 MG tablet Take 1 tablet (50 mg total) by mouth every 6 (six) hours as needed. 11/20/13   Davonna Belling, MD   BP 146/88 mmHg  Pulse 49  Temp(Src) 97.8 F (36.6 C) (Oral)  Resp 16  Ht 5\' 6"  (1.676 m)  Wt 140 lb (63.504 kg)  BMI 22.61 kg/m2  SpO2 99% Physical Exam  Constitutional: He is oriented to person, place, and time. He appears well-developed and well-nourished. No distress.  HENT:  Head: Normocephalic and atraumatic.  Right Ear: External ear normal.  Left Ear: External ear normal.  Nose: Nose normal.  Eyes: Conjunctivae are normal.  Neck: Neck supple.  Cardiovascular: Regular rhythm and normal heart sounds.  Bradycardia present.   Pulmonary/Chest: Effort normal and breath sounds  normal.  Abdominal: Soft. There is no tenderness.  Musculoskeletal: He exhibits no edema.  Neurological: He is alert and oriented to person, place, and time.  Skin: Skin is warm and dry. He is not diaphoretic.  Nursing note and vitals reviewed.   ED Course  Procedures (including critical care time) Medications - No data to display  Labs Review Labs Reviewed  BASIC METABOLIC PANEL - Abnormal; Notable for the following:    Calcium 8.8 (*)    All other components  within normal limits  CBC - Abnormal; Notable for the following:    Platelets 139 (*)    All other components within normal limits  I-STAT TROPOININ, ED    Imaging Review Dg Chest 2 View  07/13/2015   CLINICAL DATA:  Left-sided chest pain began this afternoon.  EXAM: CHEST  2 VIEW  COMPARISON:  11/20/2013  FINDINGS: The lungs are hyperinflated likely secondary to COPD. There is no focal parenchymal opacity. There is no pleural effusion or pneumothorax. The heart and mediastinal contours are unremarkable.  The osseous structures are unremarkable.  IMPRESSION: No active cardiopulmonary disease.   Electronically Signed   By: Kathreen Devoid   On: 07/13/2015 17:37   I have personally reviewed and evaluated these images and lab results as part of my medical decision-making.   EKG Interpretation None      MDM   Final diagnoses:  Acute chest pain    Filed Vitals:   07/13/15 1945  BP: 146/88  Pulse: 49  Temp:   Resp: 16   Afebrile, NAD, non-toxic appearing, AAOx4.   Concern for cardiac etiology of Chest Pain. Hospitalist has been consulted and will see patient in the ED for likely admit. Pt does not meet criteria for CP protocol and a further evaluation is recommended. Pt has been re-evaluated prior to consult and VSS, NAD, heart RRR, pain 0/10, lungs CTAB. No acute abnormalities found on EKG and first round of cardiac enzymes negative. This case was discussed with Dr. Kathrynn Humble who agrees with plan to admit.      Baron Sane, PA-C 07/13/15 1952  Varney Biles, MD 07/15/15 1735

## 2015-07-14 ENCOUNTER — Telehealth: Payer: Self-pay | Admitting: Cardiology

## 2015-07-14 ENCOUNTER — Observation Stay (HOSPITAL_COMMUNITY): Payer: Medicare Other

## 2015-07-14 DIAGNOSIS — K219 Gastro-esophageal reflux disease without esophagitis: Secondary | ICD-10-CM

## 2015-07-14 DIAGNOSIS — R001 Bradycardia, unspecified: Secondary | ICD-10-CM | POA: Diagnosis not present

## 2015-07-14 DIAGNOSIS — R0789 Other chest pain: Secondary | ICD-10-CM | POA: Diagnosis not present

## 2015-07-14 DIAGNOSIS — R079 Chest pain, unspecified: Secondary | ICD-10-CM | POA: Diagnosis not present

## 2015-07-14 DIAGNOSIS — E785 Hyperlipidemia, unspecified: Secondary | ICD-10-CM | POA: Diagnosis not present

## 2015-07-14 LAB — TROPONIN I

## 2015-07-14 MED ORDER — PANTOPRAZOLE SODIUM 40 MG PO TBEC: 40.0000 mg | DELAYED_RELEASE_TABLET | Freq: Every day | ORAL | Status: DC

## 2015-07-14 NOTE — Telephone Encounter (Signed)
We need to know what type of nuclear stress  test  you want for pt to have done.

## 2015-07-14 NOTE — Consult Note (Signed)
Admit date: 07/13/2015 Referring Physician  Dr. Conley Canal Primary Physician Horton Finer, MD Primary Cardiologist  Dr. Julianne Handler Reason for Consultation  Chest pain  HPI: 81 year old with hyperlipidemia former patient of Dr. Nancee Liter who presented with chest discomfort on 07/13/15 in the evening. He has GERD and sometimes has discomfort that feels like heartburn however this time it seemed to be more severe and lasted much longer than previous episodes. Maalox, aspirin taken. Pain resolved once he arrived in the emergency room and he did feel as though he had some associated nausea.  Denies any fevers, chills, vomiting, diarrhea, orthopnea, PND, syncope.  He smokes several years ago. No significant alcohol use. No early family history of coronary artery disease. No prior cardiovascular illness.  EKG showed sinus bradycardia rate 48 with no other significant abnormalities. Chest x-ray personally viewed was unremarkable. Troponins have been negative. Platelets are slightly low at 139. Creatinine is normal at 0.9.  Currently he is doing well without any chest discomfort. He wonders if this was just a more severe episode of GERD. His wife sees Dr.McAlhany and he in fact has an appointment set up with him next week just to establish care and to get checked out.  PMH:   Past Medical History  Diagnosis Date  . Hyperlipemia     PSH:  History reviewed. No pertinent past surgical history. Allergies:  Review of patient's allergies indicates no known allergies. Prior to Admit Meds:   Prior to Admission medications   Medication Sig Start Date End Date Taking? Authorizing Provider  celecoxib (CELEBREX) 200 MG capsule Take 200 mg by mouth daily as needed for mild pain or moderate pain.  06/08/15  Yes Historical Provider, MD  cholecalciferol (VITAMIN D) 1000 UNITS tablet Take 1,000 Units by mouth daily.   Yes Historical Provider, MD  Cyanocobalamin (VITAMIN B-12 IJ) Inject 1 mL as directed  every 30 (thirty) days.   Yes Historical Provider, MD  finasteride (PROSCAR) 5 MG tablet Take 5 mg by mouth at bedtime. 07/06/15  Yes Historical Provider, MD  simvastatin (ZOCOR) 10 MG tablet Take 10 mg by mouth daily.   Yes Historical Provider, MD  Dextromethorphan-Guaifenesin (TUSSIN DM MAX) 10-200 MG/5ML LIQD Take 10 mLs by mouth every 6 (six) hours as needed (cough). Patient not taking: Reported on 07/13/2015 11/20/13   Davonna Belling, MD  traMADol (ULTRAM) 50 MG tablet Take 1 tablet (50 mg total) by mouth every 6 (six) hours as needed. Patient not taking: Reported on 07/13/2015 11/20/13   Davonna Belling, MD   Fam HX:   No early family history of coronary artery disease. Social HX:    Social History   Social History  . Marital Status: Married    Spouse Name: N/A  . Number of Children: N/A  . Years of Education: N/A   Occupational History  . Not on file.   Social History Main Topics  . Smoking status: Former Research scientist (life sciences)  . Smokeless tobacco: Not on file  . Alcohol Use: No  . Drug Use: No  . Sexual Activity: Not on file   Other Topics Concern  . Not on file   Social History Narrative     ROS:  All 11 ROS were addressed and are negative except what is stated in the HPI   Physical Exam: Blood pressure 137/75, pulse 47, temperature 97.7 F (36.5 C), temperature source Oral, resp. rate 18, height 5\' 6"  (1.676 m), weight 146 lb (66.225 kg), SpO2 97 %.   General: Well  developed, elderly, well nourished, in no acute distress Head: Eyes PERRLA, No xanthomas.   Normal cephalic and atramatic  Lungs:   Clear bilaterally to auscultation and percussion. Normal respiratory effort. No wheezes, no rales. Heart:   HRRR S1 S2 Pulses are 2+ & equal. No murmur, rubs, gallops.  No carotid bruit. No JVD.  No abdominal bruits.  Abdomen: Bowel sounds are positive, abdomen soft and non-tender without masses. No hepatosplenomegaly. Msk:  Back normal. Normal strength and tone for age. Extremities:  No  clubbing, cyanosis or edema.  DP +1 Neuro: Alert and oriented X 3, non-focal, MAE x 4 GU: Deferred Rectal: Deferred Psych:  Good affect, responds appropriately      Labs: Lab Results  Component Value Date   WBC 8.8 07/13/2015   HGB 13.3 07/13/2015   HCT 39.5 07/13/2015   MCV 89.0 07/13/2015   PLT 139* 07/13/2015     Recent Labs Lab 07/13/15 1712  NA 139  K 4.1  CL 108  CO2 24  BUN 20  CREATININE 0.92  CALCIUM 8.8*  GLUCOSE 92    Recent Labs  07/13/15 2219 07/14/15 0345  TROPONINI <0.03 <0.03   No results found for: CHOL, HDL, LDLCALC, TRIG No results found for: DDIMER   Radiology:  Dg Chest 2 View  07/13/2015   CLINICAL DATA:  Left-sided chest pain began this afternoon.  EXAM: CHEST  2 VIEW  COMPARISON:  11/20/2013  FINDINGS: The lungs are hyperinflated likely secondary to COPD. There is no focal parenchymal opacity. There is no pleural effusion or pneumothorax. The heart and mediastinal contours are unremarkable.  The osseous structures are unremarkable.  IMPRESSION: No active cardiopulmonary disease.   Electronically Signed   By: Kathreen Devoid   On: 07/13/2015 17:37   Personally viewed.  EKG:  Sinus bradycardia 48, no ST segment changes. Personally viewed.   ASSESSMENT/PLAN:    81 year old male with hyperlipidemia here with chest discomfort, GERD, normal troponin, sinus bradycardia asymptomatic with no ST segment changes on EKG.  1. Chest discomfort-seems to be possibly associated with his GERD. When he took Maalox it seemed to minimize the pain somewhat. He was concerned however. His wife sees Dr. Angelena Form and in fact, he had an appointment with him set up next week to establish care. He is feeling well currently. No further chest discomfort. I feel comfortable getting him set up for an outpatient nuclear stress test. Continued with appointment with Dr. Angelena Form.   2. GERD-possible etiology for discomfort. Consider PPI. Avoid excessive NSAIDs.  3.  Hyperlipidemia-continue with statin.  4. Sinus bradycardia-rate as low as 40 bpm during the night. He is asymptomatic. No syncope. No indication for pacemaker. Avoid AV nodal blocking agents such as beta blockers. He is quite active at home, works in his garden/yard very vigorously. No symptoms.  Comfortable with discharge home. We will set up nuclear stress test. Office will be in contact with him. Discussed with Dr. Conley Canal.  Candee Furbish, MD  07/14/2015  8:11 AM

## 2015-07-14 NOTE — Discharge Summary (Signed)
Physician Discharge Summary  John Sosa TWS:[id number] DOB: 1929/01/27 DOA: 07/13/2015  PCP: Horton Finer, MD  Admit date: 07/13/2015 Discharge date: 07/14/2015  Time spent: greater than 30 minutes  Recommendations for Outpatient Follow-up:  1. Consider outpatient stress test.  Has appointment with Dr. Julianne Handler next week  Discharge Diagnoses:  Principal Problem:   Chest pain Active Problems:   Hyperlipemia   GERD (gastroesophageal reflux disease)   BPH (benign prostatic hypertrophy)   Sinus bradycardia on ECG   Discharge Condition: stable  Diet recommendation: heart healthy  Filed Weights   07/13/15 1709 07/13/15 2046  Weight: 63.504 kg (140 lb) 66.225 kg (146 lb)    History of present illness:  81 yo male healthy lives at home with wife around 3pm today started having lefts sided chest pain that felt like his heartburn. Pt reports his "heartburn" always is the left chest not epigastric, but this time it was more severe and lasted longer. He took aspirin, his wifes ntg tablet and maalox. His pain resolved by the time he arrived the ED. Lasted about 30 minutes and was associated with nausea. Has had stress testing done years ago, they dont remember when. Wife provides some history also. No recent illnesses. No fevers, no cough or swelling in his legs. He is referred for admission for evaluation of his chest pain.  Hospital Course:  Observed on telemetry. MI ruled out. Cardiology consulted.  . Chest discomfort-seems to be possibly associated with his GERD. When he took Maalox it seemed to minimize the pain somewhat. He was concerned however. His wife sees Dr. Angelena Form and in fact, he had an appointment with him set up next week to establish care. He is feeling well currently. No further chest discomfort. I feel comfortable getting him set up for an outpatient nuclear stress test. Continued with appointment with Dr. Angelena Form.   2. GERD-possible etiology for  discomfort. Started PPI  3. Hyperlipidemia-continue with statin.  4. Sinus bradycardia-rate as low as 40 bpm during the night. He is asymptomatic. No syncope. No indication for pacemaker. Avoid AV nodal blocking agents such as beta blockers. He is quite active at home, works in his garden/yard very vigorously. No symptoms.  Procedures: None  Consultations:  cardiology  Discharge Exam: Filed Vitals:   07/14/15 0759  BP: 137/75  Pulse: 47  Temp: 97.7 F (36.5 C)  Resp: 18    General: comfortable Cardiovascular: RRR without MGR Respiratory: CTA without WRR  Discharge Instructions   Discharge Instructions    Diet - low sodium heart healthy    Complete by:  As directed      Increase activity slowly    Complete by:  As directed           Current Discharge Medication List    START taking these medications   Details  pantoprazole (PROTONIX) 40 MG tablet Take 1 tablet (40 mg total) by mouth daily. Qty: 30 tablet, Refills: 1      CONTINUE these medications which have NOT CHANGED   Details  celecoxib (CELEBREX) 200 MG capsule Take 200 mg by mouth daily as needed for mild pain or moderate pain.  Refills: 1    cholecalciferol (VITAMIN D) 1000 UNITS tablet Take 1,000 Units by mouth daily.    Cyanocobalamin (VITAMIN B-12 IJ) Inject 1 mL as directed every 30 (thirty) days.    finasteride (PROSCAR) 5 MG tablet Take 5 mg by mouth at bedtime. Refills: 3    simvastatin (ZOCOR) 10 MG tablet Take  10 mg by mouth daily.    Dextromethorphan-Guaifenesin (TUSSIN DM MAX) 10-200 MG/5ML LIQD Take 10 mLs by mouth every 6 (six) hours as needed (cough). Qty: 118 mL, Refills: 0    traMADol (ULTRAM) 50 MG tablet Take 1 tablet (50 mg total) by mouth every 6 (six) hours as needed. Qty: 15 tablet, Refills: 0       No Known Allergies    The results of significant diagnostics from this hospitalization (including imaging, microbiology, ancillary and laboratory) are listed below for  reference.    Significant Diagnostic Studies: Dg Chest 2 View  07/13/2015   CLINICAL DATA:  Left-sided chest pain began this afternoon.  EXAM: CHEST  2 VIEW  COMPARISON:  11/20/2013  FINDINGS: The lungs are hyperinflated likely secondary to COPD. There is no focal parenchymal opacity. There is no pleural effusion or pneumothorax. The heart and mediastinal contours are unremarkable.  The osseous structures are unremarkable.  IMPRESSION: No active cardiopulmonary disease.   Electronically Signed   By: Kathreen Devoid   On: 07/13/2015 17:37    Microbiology: No results found for this or any previous visit (from the past 240 hour(s)).   Labs: Basic Metabolic Panel:  Recent Labs Lab 07/13/15 1712  NA 139  K 4.1  CL 108  CO2 24  GLUCOSE 92  BUN 20  CREATININE 0.92  CALCIUM 8.8*   Liver Function Tests: No results for input(s): AST, ALT, ALKPHOS, BILITOT, PROT, ALBUMIN in the last 168 hours. No results for input(s): LIPASE, AMYLASE in the last 168 hours. No results for input(s): AMMONIA in the last 168 hours. CBC:  Recent Labs Lab 07/13/15 1712  WBC 8.8  HGB 13.3  HCT 39.5  MCV 89.0  PLT 139*   Cardiac Enzymes:  Recent Labs Lab 07/13/15 2219 07/14/15 0345  TROPONINI <0.03 <0.03   BNP: BNP (last 3 results) No results for input(s): BNP in the last 8760 hours.  ProBNP (last 3 results) No results for input(s): PROBNP in the last 8760 hours.  CBG: No results for input(s): GLUCAP in the last 168 hours.     SignedDelfina Redwood  [facility] 07/14/2015, 9:08 AM

## 2015-07-14 NOTE — Progress Notes (Signed)
Pt discharged home with wife and daughter.  Reviewed discharge instructions and education, all questions answered.  Assessment unchanged from earlier.

## 2015-07-14 NOTE — Telephone Encounter (Signed)
New Message  Dr. Marlou Porch sent a staff message to [facility]. No order on file. Can we have someone to put the order in.    Please set up for nuclear stress test. Diagnosis chest pain.  Please have it forwarded to Dr. Angelena Form who will be seeing him next week in the office.  Candee Furbish, MD

## 2015-07-15 ENCOUNTER — Telehealth (HOSPITAL_COMMUNITY): Payer: Self-pay

## 2015-07-15 NOTE — Telephone Encounter (Signed)
Exercise stress test order is in. Pt is aware that the scheduler will call him to scheduled test.

## 2015-07-15 NOTE — Telephone Encounter (Signed)
Exercise, thanks Candee Furbish, MD

## 2015-07-15 NOTE — Telephone Encounter (Signed)
Patient given permission for me to speak to his wife. He is hard of hearing.  Patient's wife (per patients permission) given detailed instructions per Myocardial Perfusion Study Information Sheet for test on 07-20-2015 at 0915. Patient's wife notified to arrive 15 minutes early and that it is imperative to arrive on time for appointment to keep from having the test rescheduled. If you need to cancel or reschedule your appointment, please call the office within 24 hours of your appointment. Failure to do so may result in a cancellation of your appointment, and a $50 no show fee. Patient's wife verbalized understanding. Oletta Lamas, Shariff Lasky A

## 2015-07-20 ENCOUNTER — Ambulatory Visit (HOSPITAL_COMMUNITY): Payer: Medicare Other | Attending: Cardiology

## 2015-07-20 DIAGNOSIS — Z87891 Personal history of nicotine dependence: Secondary | ICD-10-CM | POA: Diagnosis not present

## 2015-07-20 DIAGNOSIS — R079 Chest pain, unspecified: Secondary | ICD-10-CM | POA: Insufficient documentation

## 2015-07-20 LAB — MYOCARDIAL PERFUSION IMAGING
CHL CUP NUCLEAR SDS: 2
CHL CUP NUCLEAR SRS: 4
CSEPEW: 7 METS
CSEPPHR: 141 {beats}/min
Exercise duration (min): 6 min
Exercise duration (sec): 0 s
LHR: 0.34
LV sys vol: 30 mL
LVDIAVOL: 81 mL
MPHR: 136 {beats}/min
Percent HR: 103 %
Rest HR: 46 {beats}/min
SSS: 6
TID: 1.02

## 2015-07-20 MED ORDER — TECHNETIUM TC 99M SESTAMIBI GENERIC - CARDIOLITE
32.2000 | Freq: Once | INTRAVENOUS | Status: AC | PRN
  Administered 2015-07-20: 32.2 via INTRAVENOUS

## 2015-07-20 MED ORDER — TECHNETIUM TC 99M SESTAMIBI GENERIC - CARDIOLITE
10.7000 | Freq: Once | INTRAVENOUS | Status: AC | PRN
  Administered 2015-07-20: 11 via INTRAVENOUS

## 2015-07-21 ENCOUNTER — Other Ambulatory Visit: Payer: Self-pay

## 2015-07-21 ENCOUNTER — Telehealth: Payer: Self-pay

## 2015-07-21 NOTE — Telephone Encounter (Signed)
PT AWARE OF MYOVIEW RESULTS./CY 

## 2015-07-21 NOTE — Telephone Encounter (Signed)
F/u      Pt's wife returning Christine's phone call for results.

## 2015-07-22 ENCOUNTER — Encounter: Payer: Self-pay | Admitting: Cardiovascular Disease

## 2015-07-22 ENCOUNTER — Ambulatory Visit (INDEPENDENT_AMBULATORY_CARE_PROVIDER_SITE_OTHER): Payer: Medicare Other | Admitting: Cardiovascular Disease

## 2015-07-22 VITALS — BP 108/60 | HR 49 | Ht 66.0 in | Wt 147.8 lb

## 2015-07-22 DIAGNOSIS — K219 Gastro-esophageal reflux disease without esophagitis: Secondary | ICD-10-CM | POA: Diagnosis not present

## 2015-07-22 DIAGNOSIS — R079 Chest pain, unspecified: Secondary | ICD-10-CM | POA: Diagnosis not present

## 2015-07-22 DIAGNOSIS — E785 Hyperlipidemia, unspecified: Secondary | ICD-10-CM

## 2015-07-22 NOTE — Progress Notes (Signed)
Chief Complaint  Patient presents with  . Chest Pain     History of Present Illness: 81 yo male with history of GERD, HLD who is here today for cardiac follow up. He was evaluated in the [facility] ED 06/3015 by Dr. Marlou Porch after an episode of chest pain. He has no known CAD. I take care of his wife but have not seen him as a patient before today. In the ED, troponin negative and EKG without ischemic changes. Stress myoview 07/20/15 with no ischemia.   He is here today for follow up. He has had no chest pain or SOB. He has been working in the yard for 2-3 hours with no SOB or chest pain. No LE edema.   Primary Care Physician: Deforest Hoyles   Past Medical History  Diagnosis Date  . Hyperlipemia   . GERD (gastroesophageal reflux disease)     Past Surgical History  Procedure Laterality Date  . Hemorrhoid surgery  1950    Current Outpatient Prescriptions  Medication Sig Dispense Refill  . B-D 3CC LUER-LOK SYR 25GX5/8" 25G X 5/8" 3 ML MISC See admin instructions.  1  . celecoxib (CELEBREX) 200 MG capsule Take 200 mg by mouth daily as needed for mild pain or moderate pain.   1  . cholecalciferol (VITAMIN D) 1000 UNITS tablet Take 1,000 Units by mouth daily.    . Cyanocobalamin (VITAMIN B-12 IJ) Inject 1 mL as directed every 30 (thirty) days.    . finasteride (PROSCAR) 5 MG tablet Take 5 mg by mouth at bedtime.  3  . pantoprazole (PROTONIX) 40 MG tablet Take 1 tablet (40 mg total) by mouth daily. 30 tablet 1  . simvastatin (ZOCOR) 10 MG tablet Take 10 mg by mouth daily.    Marland Kitchen triamcinolone (NASACORT ALLERGY 24HR) 55 MCG/ACT AERO nasal inhaler Place 2 sprays into the nose as needed.     No current facility-administered medications for this visit.    No Known Allergies  Social History   Social History  . Marital Status: Married    Spouse Name: N/A  . Number of Children: 3  . Years of Education: N/A   Occupational History  . Retired-Textile    Social History Main Topics  . Smoking status:  Former Smoker    Quit date: 11/20/1956  . Smokeless tobacco: Not on file  . Alcohol Use: No  . Drug Use: No  . Sexual Activity: Not on file   Other Topics Concern  . Not on file   Social History Narrative    Family History  Problem Relation Age of Onset  . Diabetes Brother     Review of Systems:  As stated in the HPI and otherwise negative.   BP 108/60 mmHg  Pulse 49  Ht 5\' 6"  (1.676 m)  Wt 147 lb 12.8 oz (67.042 kg)  BMI 23.87 kg/m2  SpO2 99%  Physical Examination: General: Well developed, well nourished, NAD HEENT: OP clear, mucus membranes moist SKIN: warm, dry. No rashes. Neuro: No focal deficits Musculoskeletal: Muscle strength 5/5 all ext Psychiatric: Mood and affect normal Neck: No JVD, no carotid bruits, no thyromegaly, no lymphadenopathy. Lungs:Clear bilaterally, no wheezes, rhonci, crackles Cardiovascular: Regular rate and rhythm. No murmurs, gallops or rubs. Abdomen:Soft. Bowel sounds present. Non-tender.  Extremities: No lower extremity edema. Pulses are 2 + in the bilateral DP/PT.  EKG:  EKG is not ordered today. The ekg ordered today demonstrates   Recent Labs: 07/13/2015: BUN 20; Creatinine, Ser 0.92; Hemoglobin 13.3; Platelets  139*; Potassium 4.1; Sodium 139   Lipid Panel No results found for: CHOL, TRIG, HDL, CHOLHDL, VLDL, LDLCALC, LDLDIRECT   Wt Readings from Last 3 Encounters:  07/22/15 147 lb 12.8 oz (67.042 kg)  07/20/15 146 lb (66.225 kg)  07/13/15 146 lb (66.225 kg)     Other studies Reviewed: Additional studies/ records that were reviewed today include: . Review of the above records demonstrates: **   Assessment and Plan:   1. Chest pain: No recurrence. I think this was likely GERD. He has no ischemia on his stress test. No recurrent pain with exertion. Cath is not indicated at this time.   2. GERD: Continue PPI  3. Hyperlipidemia: Continue statin.   Current medicines are reviewed at length with the patient today.  The  patient does not have concerns regarding medicines.  The following changes have been made:  no change  Labs/ tests ordered today include:  No orders of the defined types were placed in this encounter.    Disposition:   FU with me in 12  months  Signed, Lauree Chandler, MD 07/22/2015 2:55 PM    [facility] [address], [place], [place]  [address] Phone: [phone]; Fax: [phone]

## 2015-07-22 NOTE — Patient Instructions (Signed)

## 2015-08-02 DIAGNOSIS — Z23 Encounter for immunization: Secondary | ICD-10-CM | POA: Diagnosis not present

## 2015-10-11 DIAGNOSIS — J209 Acute bronchitis, unspecified: Secondary | ICD-10-CM | POA: Diagnosis not present

## 2015-10-25 DIAGNOSIS — H53001 Unspecified amblyopia, right eye: Secondary | ICD-10-CM | POA: Diagnosis not present

## 2015-10-25 DIAGNOSIS — H43813 Vitreous degeneration, bilateral: Secondary | ICD-10-CM | POA: Diagnosis not present

## 2015-10-25 DIAGNOSIS — H5212 Myopia, left eye: Secondary | ICD-10-CM | POA: Diagnosis not present

## 2015-10-25 DIAGNOSIS — H353132 Nonexudative age-related macular degeneration, bilateral, intermediate dry stage: Secondary | ICD-10-CM | POA: Diagnosis not present

## 2016-03-31 DIAGNOSIS — J069 Acute upper respiratory infection, unspecified: Secondary | ICD-10-CM | POA: Diagnosis not present

## 2016-04-30 DIAGNOSIS — R972 Elevated prostate specific antigen [PSA]: Secondary | ICD-10-CM | POA: Diagnosis not present

## 2016-04-30 DIAGNOSIS — N400 Enlarged prostate without lower urinary tract symptoms: Secondary | ICD-10-CM | POA: Diagnosis not present

## 2016-04-30 DIAGNOSIS — Z1389 Encounter for screening for other disorder: Secondary | ICD-10-CM | POA: Diagnosis not present

## 2016-04-30 DIAGNOSIS — E7800 Pure hypercholesterolemia, unspecified: Secondary | ICD-10-CM | POA: Diagnosis not present

## 2016-04-30 DIAGNOSIS — E538 Deficiency of other specified B group vitamins: Secondary | ICD-10-CM | POA: Diagnosis not present

## 2016-04-30 DIAGNOSIS — G4733 Obstructive sleep apnea (adult) (pediatric): Secondary | ICD-10-CM | POA: Diagnosis not present

## 2016-04-30 DIAGNOSIS — Z0000 Encounter for general adult medical examination without abnormal findings: Secondary | ICD-10-CM | POA: Diagnosis not present

## 2016-07-06 DIAGNOSIS — N5201 Erectile dysfunction due to arterial insufficiency: Secondary | ICD-10-CM | POA: Diagnosis not present

## 2016-07-06 DIAGNOSIS — R972 Elevated prostate specific antigen [PSA]: Secondary | ICD-10-CM | POA: Diagnosis not present

## 2016-07-23 DIAGNOSIS — Z23 Encounter for immunization: Secondary | ICD-10-CM | POA: Diagnosis not present

## 2016-08-03 ENCOUNTER — Encounter: Payer: Self-pay | Admitting: Cardiovascular Disease

## 2016-08-06 ENCOUNTER — Ambulatory Visit (INDEPENDENT_AMBULATORY_CARE_PROVIDER_SITE_OTHER): Payer: Medicare Other | Admitting: Cardiovascular Disease

## 2016-08-06 ENCOUNTER — Encounter (INDEPENDENT_AMBULATORY_CARE_PROVIDER_SITE_OTHER): Payer: Self-pay

## 2016-08-06 VITALS — BP 122/70 | HR 51 | Wt 146.0 lb

## 2016-08-06 DIAGNOSIS — R001 Bradycardia, unspecified: Secondary | ICD-10-CM

## 2016-08-06 DIAGNOSIS — R079 Chest pain, unspecified: Secondary | ICD-10-CM | POA: Diagnosis not present

## 2016-08-06 DIAGNOSIS — E785 Hyperlipidemia, unspecified: Secondary | ICD-10-CM

## 2016-08-06 IMAGING — DX DG CHEST 2V
2 series · 2 of 2 positions shown · non-contrast
Comparison: 11/20/2013

CLINICAL DATA: Left-sided chest pain began this afternoon.

EXAM:
CHEST  2 VIEW

[chest pa]
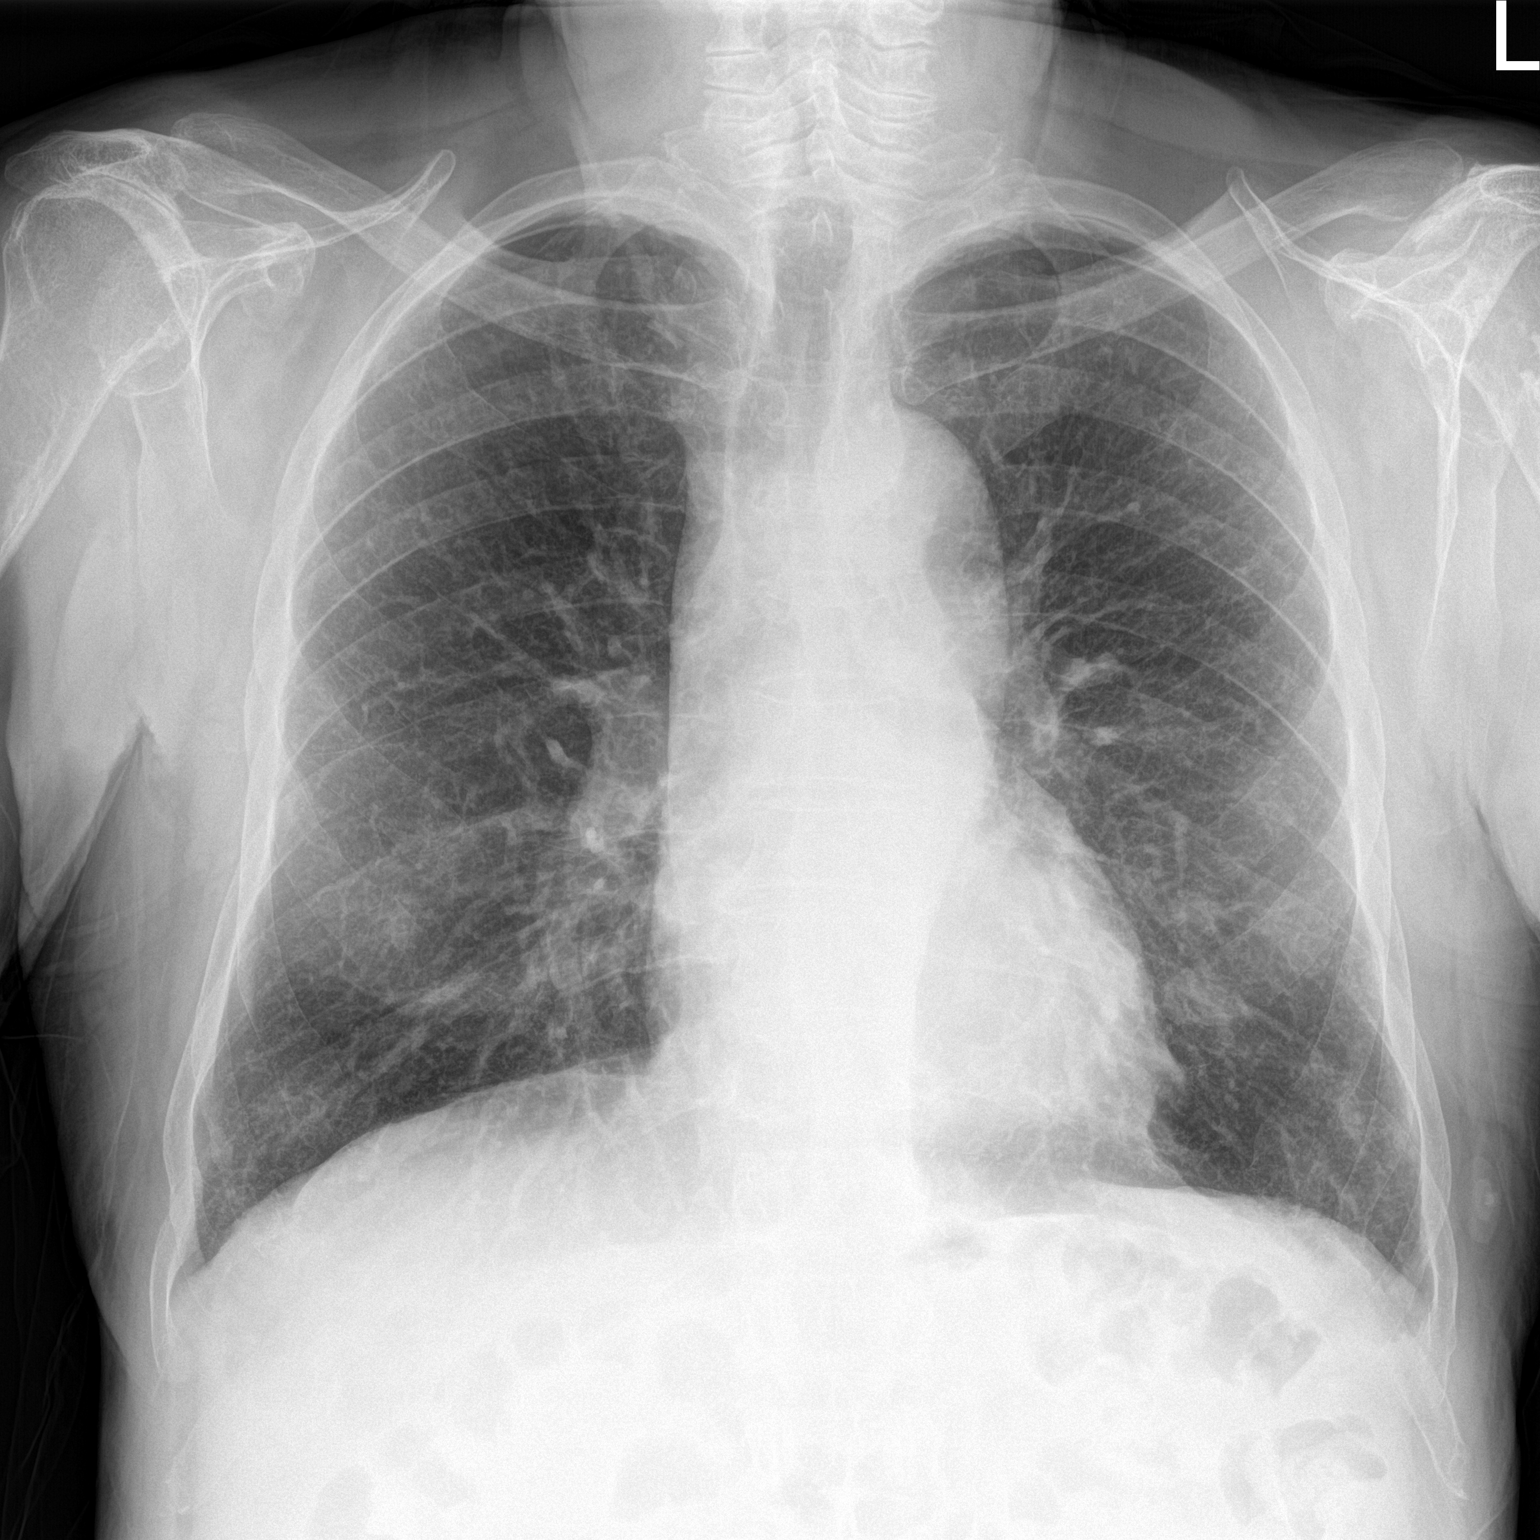

[chest lat]
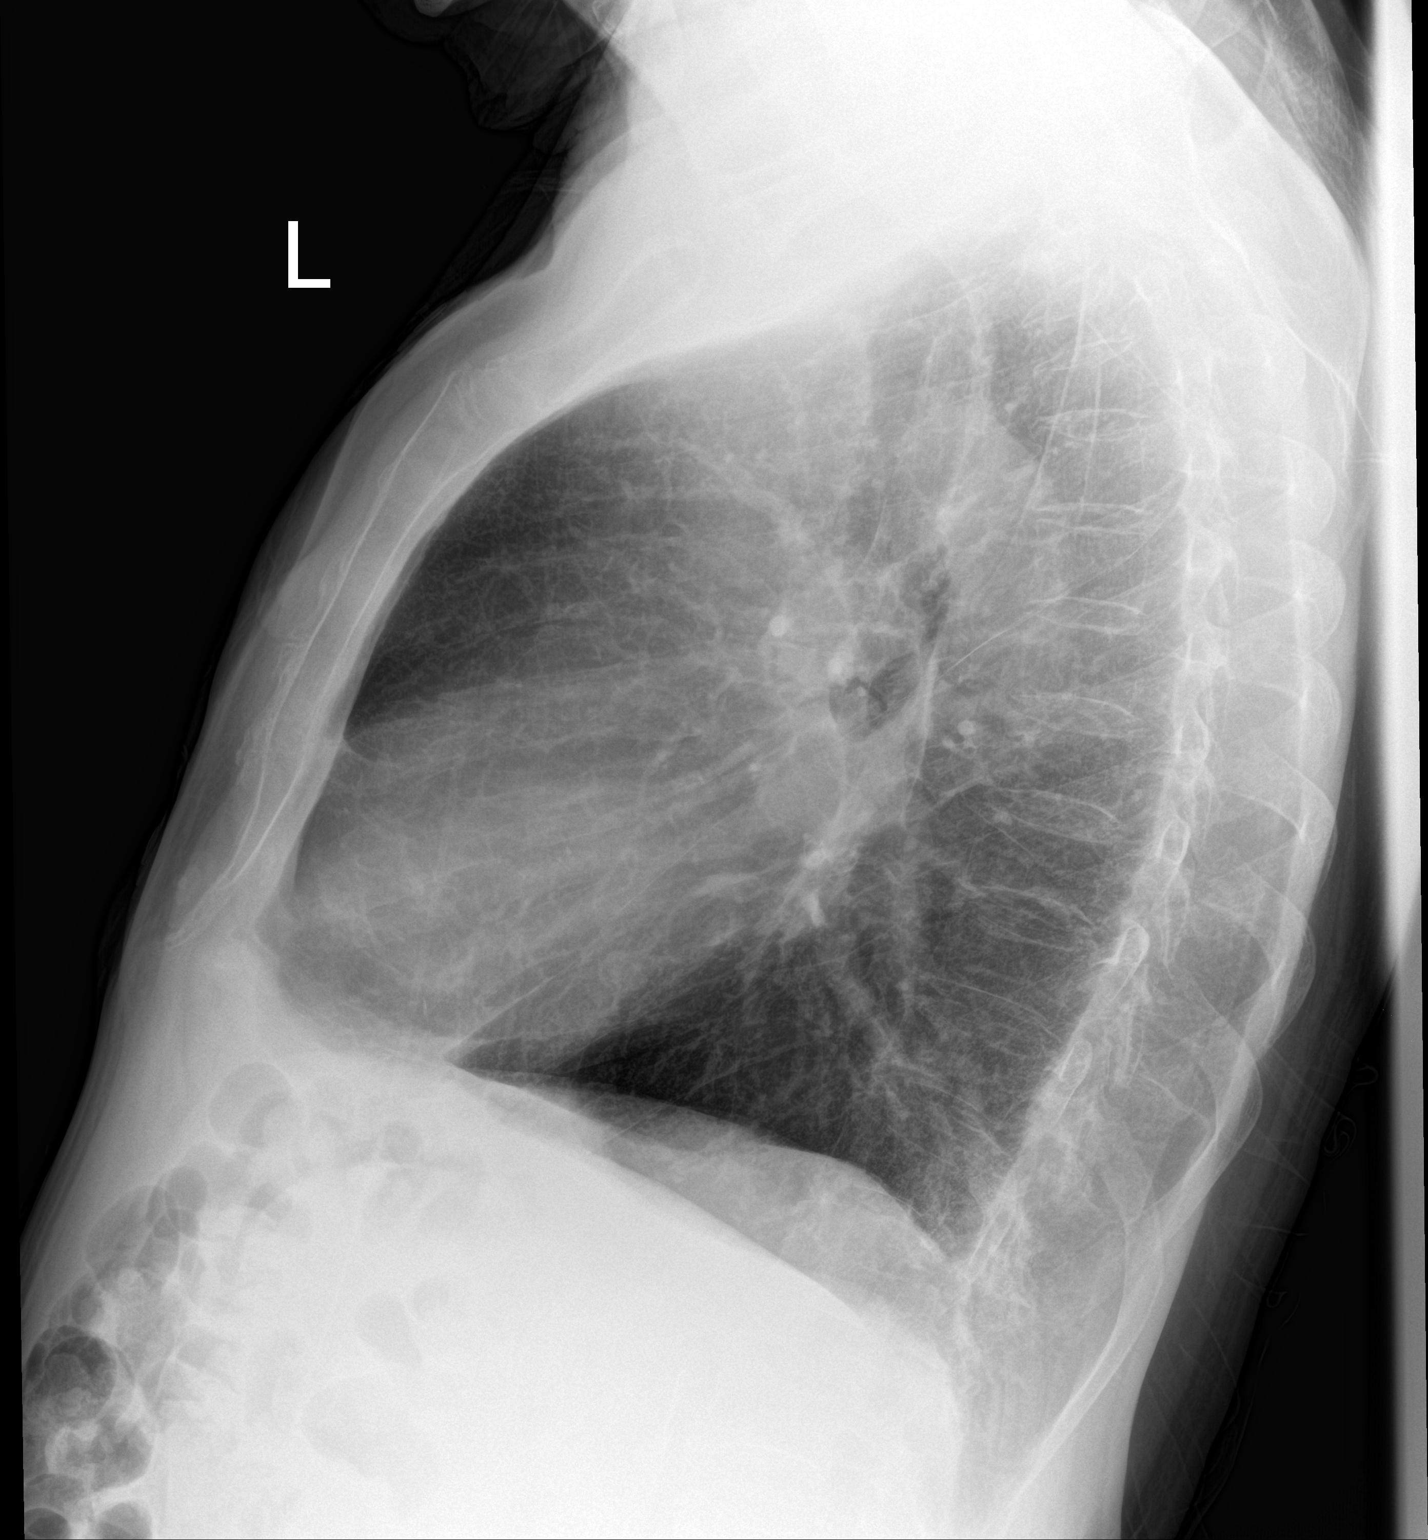

[2 of 2 positions shown; findings below may reference images not displayed]

FINDINGS: The lungs are hyperinflated likely secondary to COPD. There is no
focal parenchymal opacity. There is no pleural effusion or
pneumothorax. The heart and mediastinal contours are unremarkable.

The osseous structures are unremarkable.
IMPRESSION: No active cardiopulmonary disease.

## 2016-08-06 NOTE — Patient Instructions (Signed)

## 2016-08-06 NOTE — Progress Notes (Signed)
Chief Complaint  Patient presents with  . Chest Pain     History of Present Illness: 82 yo male with history of GERD, HLD who is here today for cardiac follow up. He was evaluated in the [facility] ED 06/3015 by Dr. Marlou Porch after an episode of chest pain. Troponin was negative, EKG without ischemic changes. Stress myoview 07/20/15 with no ischemia. He has no known CAD. I met him in September 2016. I also take care of his wife.   He is here today for follow up. He has had no exertional chest pain or SOB. He has occasional burning in chest after he eats. No LE edema.   Primary Care Physician: Wenda Low, MD   Past Medical History:  Diagnosis Date  . GERD (gastroesophageal reflux disease)   . Hyperlipemia     Past Surgical History:  Procedure Laterality Date  . HEMORRHOID SURGERY  1950    Current Outpatient Prescriptions  Medication Sig Dispense Refill  . B-D 3CC LUER-LOK SYR 25GX5/8" 25G X 5/8" 3 ML MISC See admin instructions.  1  . celecoxib (CELEBREX) 200 MG capsule Take 200 mg by mouth daily as needed for mild pain or moderate pain.   1  . Cyanocobalamin (VITAMIN B-12 IJ) Inject 1 mL as directed every 30 (thirty) days.    . finasteride (PROSCAR) 5 MG tablet Take 5 mg by mouth at bedtime.  3  . simvastatin (ZOCOR) 10 MG tablet Take 10 mg by mouth daily.    Marland Kitchen triamcinolone (NASACORT ALLERGY 24HR) 55 MCG/ACT AERO nasal inhaler Place 2 sprays into the nose as needed.     No current facility-administered medications for this visit.     No Known Allergies  Social History   Social History  . Marital status: Married    Spouse name: N/A  . Number of children: 3  . Years of education: N/A   Occupational History  . Retired-Textile    Social History Main Topics  . Smoking status: Former Smoker    Quit date: 11/20/1956  . Smokeless tobacco: Never Used  . Alcohol use No  . Drug use: No  . Sexual activity: Not on file   Other Topics Concern  . Not on file   Social History  Narrative  . No narrative on file    Family History  Problem Relation Age of Onset  . Diabetes Brother     Review of Systems:  As stated in the HPI and otherwise negative.   BP 122/70 (BP Location: Left Arm, Patient Position: Sitting, Cuff Size: Normal)   Pulse (!) 51   Wt 66.2 kg (146 lb)   BMI 23.57 kg/m   Physical Examination: General: Well developed, well nourished, NAD  HEENT: OP clear, mucus membranes moist  SKIN: warm, dry. No rashes. Neuro: No focal deficits  Musculoskeletal: Muscle strength 5/5 all ext  Psychiatric: Mood and affect normal  Neck: No JVD, no carotid bruits, no thyromegaly, no lymphadenopathy.  Lungs:Clear bilaterally, no wheezes, rhonci, crackles Cardiovascular: Regular rate and rhythm. No murmurs, gallops or rubs. Abdomen:Soft. Bowel sounds present. Non-tender.  Extremities: No lower extremity edema. Pulses are 2 + in the bilateral DP/PT.  EKG:  EKG is  ordered today. The ekg ordered today demonstrates Sinus brady, rate 51 bpm  Recent Labs: No results found for requested labs within last 8760 hours.   Lipid Panel No results found for: CHOL, TRIG, HDL, CHOLHDL, VLDL, LDLCALC, LDLDIRECT   Wt Readings from Last 3 Encounters:  08/06/16 66.2  kg (146 lb)  07/22/15 67 kg (147 lb 12.8 oz)  07/20/15 66.2 kg (146 lb)     Other studies Reviewed: Additional studies/ records that were reviewed today include: . Review of the above records demonstrates:    Assessment and Plan:   1. Chest pain: No recurrence. I think this was likely GERD. He has no ischemia on his stress test. No recurrent pain with exertion. Cath is not indicated at this time.   2. Hyperlipidemia: Continue statin.   3. Sinus bradycardia: He has no dizziness. Will arrange echo to assess LV function, exclude structural heart disease.   Current medicines are reviewed at length with the patient today.  The patient does not have concerns regarding medicines.  The following changes have  been made:  no change  Labs/ tests ordered today include:   Orders Placed This Encounter  Procedures  . EKG 12-Lead  . ECHOCARDIOGRAM COMPLETE    Disposition:   F/U with me in 12  months  Signed, Lauree Chandler, MD 08/06/2016 2:32 PM    [facility] [place], [place], [place]  [address] Phone: [phone]; Fax: [phone]

## 2016-08-14 DIAGNOSIS — S63634A Sprain of interphalangeal joint of right ring finger, initial encounter: Secondary | ICD-10-CM | POA: Diagnosis not present

## 2016-08-21 ENCOUNTER — Ambulatory Visit (HOSPITAL_COMMUNITY): Payer: Medicare Other | Attending: Cardiovascular Disease

## 2016-08-21 ENCOUNTER — Other Ambulatory Visit: Payer: Self-pay

## 2016-08-21 DIAGNOSIS — R001 Bradycardia, unspecified: Secondary | ICD-10-CM | POA: Diagnosis not present

## 2016-09-12 DIAGNOSIS — M12812 Other specific arthropathies, not elsewhere classified, left shoulder: Secondary | ICD-10-CM | POA: Diagnosis not present

## 2016-10-10 DIAGNOSIS — M12812 Other specific arthropathies, not elsewhere classified, left shoulder: Secondary | ICD-10-CM | POA: Diagnosis not present

## 2016-10-19 DIAGNOSIS — M898X1 Other specified disorders of bone, shoulder: Secondary | ICD-10-CM | POA: Diagnosis not present

## 2016-10-25 DIAGNOSIS — D171 Benign lipomatous neoplasm of skin and subcutaneous tissue of trunk: Secondary | ICD-10-CM | POA: Diagnosis not present

## 2016-10-25 DIAGNOSIS — M12812 Other specific arthropathies, not elsewhere classified, left shoulder: Secondary | ICD-10-CM | POA: Diagnosis not present

## 2016-11-13 DIAGNOSIS — H53001 Unspecified amblyopia, right eye: Secondary | ICD-10-CM | POA: Diagnosis not present

## 2016-11-13 DIAGNOSIS — H353132 Nonexudative age-related macular degeneration, bilateral, intermediate dry stage: Secondary | ICD-10-CM | POA: Diagnosis not present

## 2016-11-13 DIAGNOSIS — Z961 Presence of intraocular lens: Secondary | ICD-10-CM | POA: Diagnosis not present

## 2016-11-13 DIAGNOSIS — H52201 Unspecified astigmatism, right eye: Secondary | ICD-10-CM | POA: Diagnosis not present

## 2017-02-21 ENCOUNTER — Ambulatory Visit
Admission: RE | Admit: 2017-02-21 | Discharge: 2017-02-21 | Disposition: A | Discharge status: Home / Self Care | Payer: Medicare Other | Source: Ambulatory Visit | Attending: Internal Medicine | Admitting: Internal Medicine

## 2017-02-21 ENCOUNTER — Other Ambulatory Visit: Payer: Self-pay | Admitting: Internal Medicine

## 2017-02-21 DIAGNOSIS — S2242XA Multiple fractures of ribs, left side, initial encounter for closed fracture: Secondary | ICD-10-CM | POA: Diagnosis not present

## 2017-02-21 DIAGNOSIS — R0789 Other chest pain: Secondary | ICD-10-CM

## 2017-05-01 DIAGNOSIS — Z1389 Encounter for screening for other disorder: Secondary | ICD-10-CM | POA: Diagnosis not present

## 2017-05-01 DIAGNOSIS — N400 Enlarged prostate without lower urinary tract symptoms: Secondary | ICD-10-CM | POA: Diagnosis not present

## 2017-05-01 DIAGNOSIS — R972 Elevated prostate specific antigen [PSA]: Secondary | ICD-10-CM | POA: Diagnosis not present

## 2017-05-01 DIAGNOSIS — Z0000 Encounter for general adult medical examination without abnormal findings: Secondary | ICD-10-CM | POA: Diagnosis not present

## 2017-05-01 DIAGNOSIS — G43009 Migraine without aura, not intractable, without status migrainosus: Secondary | ICD-10-CM | POA: Diagnosis not present

## 2017-05-01 DIAGNOSIS — E7800 Pure hypercholesterolemia, unspecified: Secondary | ICD-10-CM | POA: Diagnosis not present

## 2017-05-01 DIAGNOSIS — E538 Deficiency of other specified B group vitamins: Secondary | ICD-10-CM | POA: Diagnosis not present

## 2017-05-31 DIAGNOSIS — M12812 Other specific arthropathies, not elsewhere classified, left shoulder: Secondary | ICD-10-CM | POA: Diagnosis not present

## 2017-08-09 ENCOUNTER — Ambulatory Visit (INDEPENDENT_AMBULATORY_CARE_PROVIDER_SITE_OTHER): Payer: Medicare Other | Admitting: Cardiovascular Disease

## 2017-08-09 ENCOUNTER — Encounter: Payer: Self-pay | Admitting: Cardiovascular Disease

## 2017-08-09 VITALS — BP 118/60 | HR 58 | Ht 66.0 in | Wt 146.8 lb

## 2017-08-09 DIAGNOSIS — E7800 Pure hypercholesterolemia, unspecified: Secondary | ICD-10-CM

## 2017-08-09 DIAGNOSIS — R072 Precordial pain: Secondary | ICD-10-CM | POA: Diagnosis not present

## 2017-08-09 DIAGNOSIS — R001 Bradycardia, unspecified: Secondary | ICD-10-CM

## 2017-08-09 DIAGNOSIS — Z23 Encounter for immunization: Secondary | ICD-10-CM | POA: Diagnosis not present

## 2017-08-09 NOTE — Patient Instructions (Signed)
Medication Instructions:  Your physician recommends that you continue on your current medications as directed. Please refer to the Current Medication list given to you today.   Labwork: none  Testing/Procedures: non  Follow-Up: Your physician recommends that you schedule a follow-up appointment in: 12 months. Please call our office in about 9 months to schedule this appointment.     Any Other Special Instructions Will Be Listed Below (If Applicable).     If you need a refill on your cardiac medications before your next appointment, please call your pharmacy.

## 2017-08-09 NOTE — Progress Notes (Signed)
Chief Complaint  Patient presents with  . Follow-up    chest pain     History of Present Illness: 83 yo male with history of GERD, HLD who is here today for cardiac follow up. He was evaluated in the [facility] ED 06/3015 after an episode of chest pain. Troponin was negative, EKG without ischemic changes. Stress myoview 07/20/15 with no ischemia. He has no known CAD. I also take care of his wife.   He is here today for follow up. The patient denies any chest pain, dyspnea, palpitations, lower extremity edema, orthopnea, PND, dizziness, near syncope or syncope.     Primary Care Physician: Wenda Low, MD   Past Medical History:  Diagnosis Date  . GERD (gastroesophageal reflux disease)   . Hyperlipemia     Past Surgical History:  Procedure Laterality Date  . HEMORRHOID SURGERY  1950    Current Outpatient Prescriptions  Medication Sig Dispense Refill  . B-D 3CC LUER-LOK SYR 25GX5/8" 25G X 5/8" 3 ML MISC See admin instructions.  1  . celecoxib (CELEBREX) 200 MG capsule Take 200 mg by mouth daily as needed for mild pain or moderate pain.   1  . Cholecalciferol (VITAMIN D3) 1000 units CAPS Take 1 capsule by mouth daily.    . Cyanocobalamin (VITAMIN B-12 IJ) Inject 1 mL as directed every 30 (thirty) days.    . finasteride (PROSCAR) 5 MG tablet Take 5 mg by mouth at bedtime.  3  . Multiple Vitamins-Minerals (PRESERVISION AREDS 2 PO) Take 1 tablet by mouth daily.    . simvastatin (ZOCOR) 10 MG tablet Take 10 mg by mouth daily.    Marland Kitchen triamcinolone (NASACORT ALLERGY 24HR) 55 MCG/ACT AERO nasal inhaler Place 2 sprays into the nose as needed.     No current facility-administered medications for this visit.     No Known Allergies  Social History   Social History  . Marital status: Married    Spouse name: N/A  . Number of children: 3  . Years of education: N/A   Occupational History  . Retired-Textile    Social History Main Topics  . Smoking status: Former Smoker    Quit date:  11/20/1956  . Smokeless tobacco: Never Used  . Alcohol use No  . Drug use: No  . Sexual activity: Not on file   Other Topics Concern  . Not on file   Social History Narrative  . No narrative on file    Family History  Problem Relation Age of Onset  . Diabetes Brother     Review of Systems:  As stated in the HPI and otherwise negative.   BP 118/60   Pulse (!) 58   Ht 5\' 6"  (1.676 m)   Wt 146 lb 12.8 oz (66.6 kg)   SpO2 99%   BMI 23.69 kg/m   Physical Examination:  General: Well developed, well nourished, NAD  HEENT: OP clear, mucus membranes moist  SKIN: warm, dry. No rashes. Neuro: No focal deficits  Musculoskeletal: Muscle strength 5/5 all ext  Psychiatric: Mood and affect normal  Neck: No JVD, no carotid bruits, no thyromegaly, no lymphadenopathy.  Lungs:Clear bilaterally, no wheezes, rhonci, crackles Cardiovascular: Regular rate and rhythm. No murmurs, gallops or rubs. Abdomen:Soft. Bowel sounds present. Non-tender.  Extremities: No lower extremity edema. Pulses are 2 + in the bilateral DP/PT.  Echo 08/21/16: - Left ventricle: The cavity size was normal. Wall thickness was   normal. Systolic function was normal. The estimated ejection  fraction was in the range of 60% to 65%. Wall motion was normal;   there were no regional wall motion abnormalities. - Aortic valve: Mildly calcified annulus. There was trivial   regurgitation.  EKG:  EKG is  ordered today. The ekg ordered today demonstrates sinus brady, rate 56 bpm  Recent Labs: No results found for requested labs within last 8760 hours.   Lipid Panel No results found for: CHOL, TRIG, HDL, CHOLHDL, VLDL, LDLCALC, LDLDIRECT   Wt Readings from Last 3 Encounters:  08/09/17 146 lb 12.8 oz (66.6 kg)  08/06/16 146 lb (66.2 kg)  07/22/15 147 lb 12.8 oz (67 kg)     Other studies Reviewed: Additional studies/ records that were reviewed today include: . Review of the above records demonstrates:     Assessment and Plan:   1. Chest pain: His chest pain has been felt to be due to GERD. Stress test in 2016 with no ischemia.  He will try to use Prilosec OTC or Pepcid  2. Hyperlipidemia: Continue statin.   3. Sinus bradycardia: He has no dizziness. Normal LV function by echo October 2017  Current medicines are reviewed at length with the patient today.  The patient does not have concerns regarding medicines.  The following changes have been made:  no change  Labs/ tests ordered today include:   Orders Placed This Encounter  Procedures  . EKG 12-Lead    Disposition:   F/U with me in 12  months  Signed, Lauree Chandler, MD 08/09/2017 10:50 AM    [facility] [place], [place], [place]  [address] Phone: [phone]; Fax: [phone]

## 2017-12-17 DIAGNOSIS — G4733 Obstructive sleep apnea (adult) (pediatric): Secondary | ICD-10-CM | POA: Diagnosis not present

## 2018-02-18 DIAGNOSIS — H43813 Vitreous degeneration, bilateral: Secondary | ICD-10-CM | POA: Diagnosis not present

## 2018-02-18 DIAGNOSIS — H52203 Unspecified astigmatism, bilateral: Secondary | ICD-10-CM | POA: Diagnosis not present

## 2018-02-18 DIAGNOSIS — H353132 Nonexudative age-related macular degeneration, bilateral, intermediate dry stage: Secondary | ICD-10-CM | POA: Diagnosis not present

## 2018-02-18 DIAGNOSIS — H26491 Other secondary cataract, right eye: Secondary | ICD-10-CM | POA: Diagnosis not present

## 2018-02-19 DIAGNOSIS — G4733 Obstructive sleep apnea (adult) (pediatric): Secondary | ICD-10-CM | POA: Diagnosis not present

## 2018-03-05 DIAGNOSIS — G4733 Obstructive sleep apnea (adult) (pediatric): Secondary | ICD-10-CM | POA: Diagnosis not present

## 2018-03-18 IMAGING — DX DG RIBS W/ CHEST 3+V*L*
3 series · 3 of 3 positions shown · non-contrast
Comparison: 07/13/2015

CLINICAL DATA: Left chest wall pain.  Fell 2 weeks ago

EXAM:
LEFT RIBS AND CHEST - 3+ VIEW

[dg ribs unilateral w/chest left (1 of 3)]
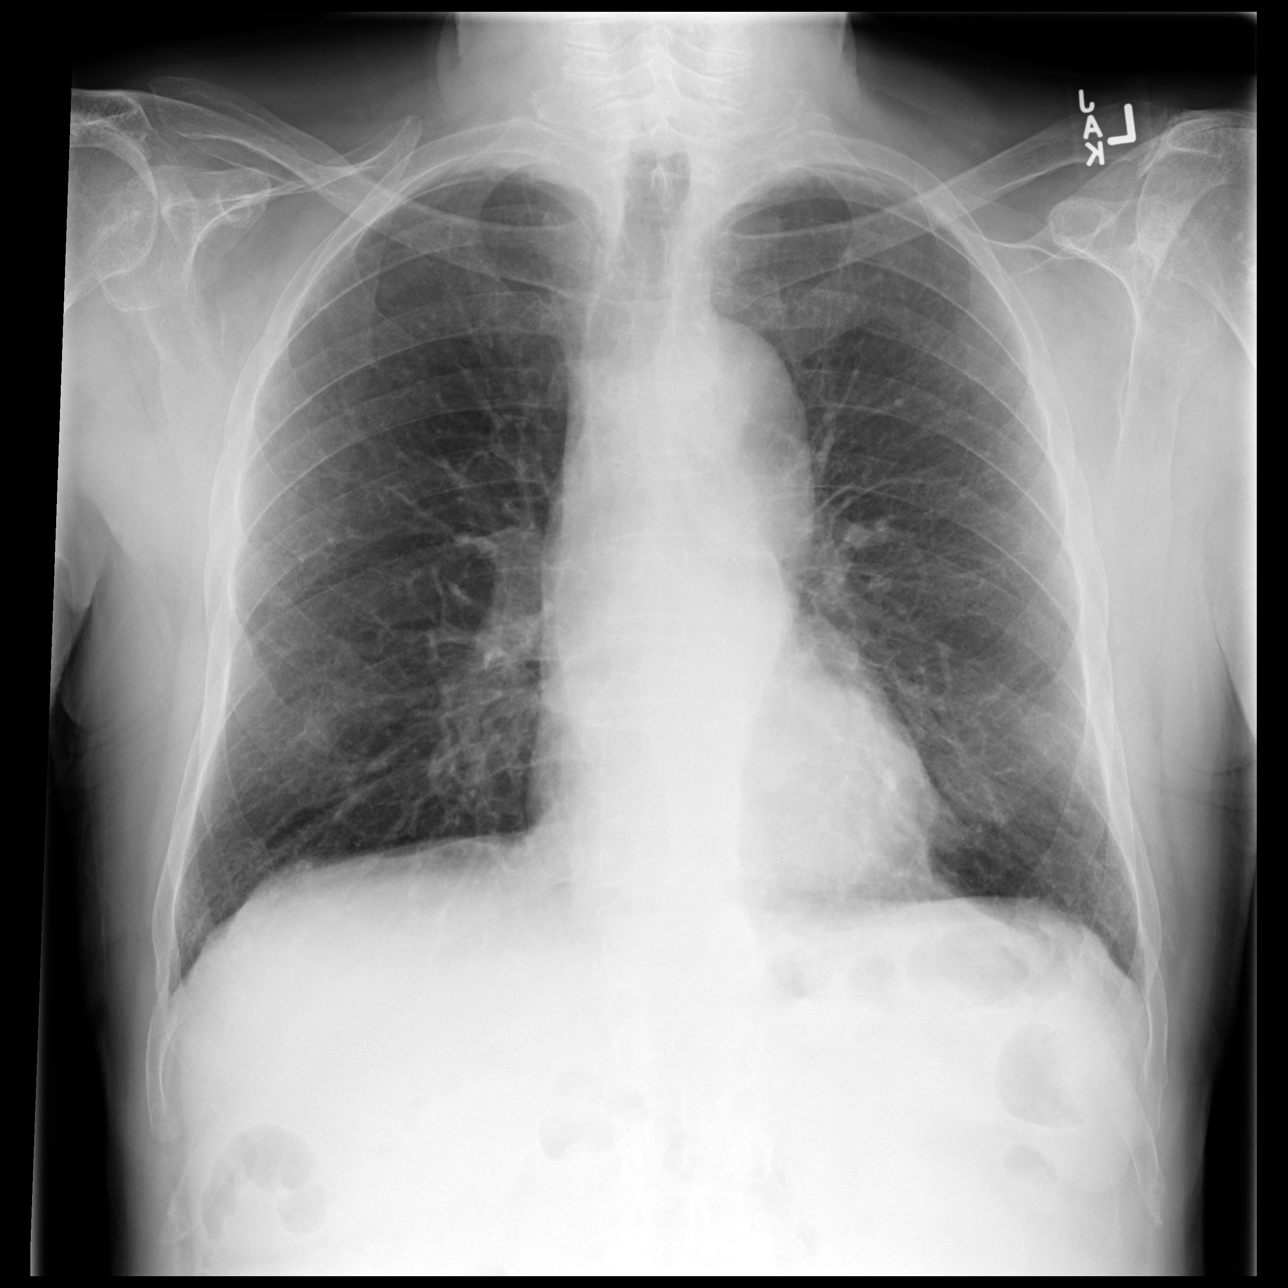

[dg ribs unilateral w/chest left (2 of 3)]
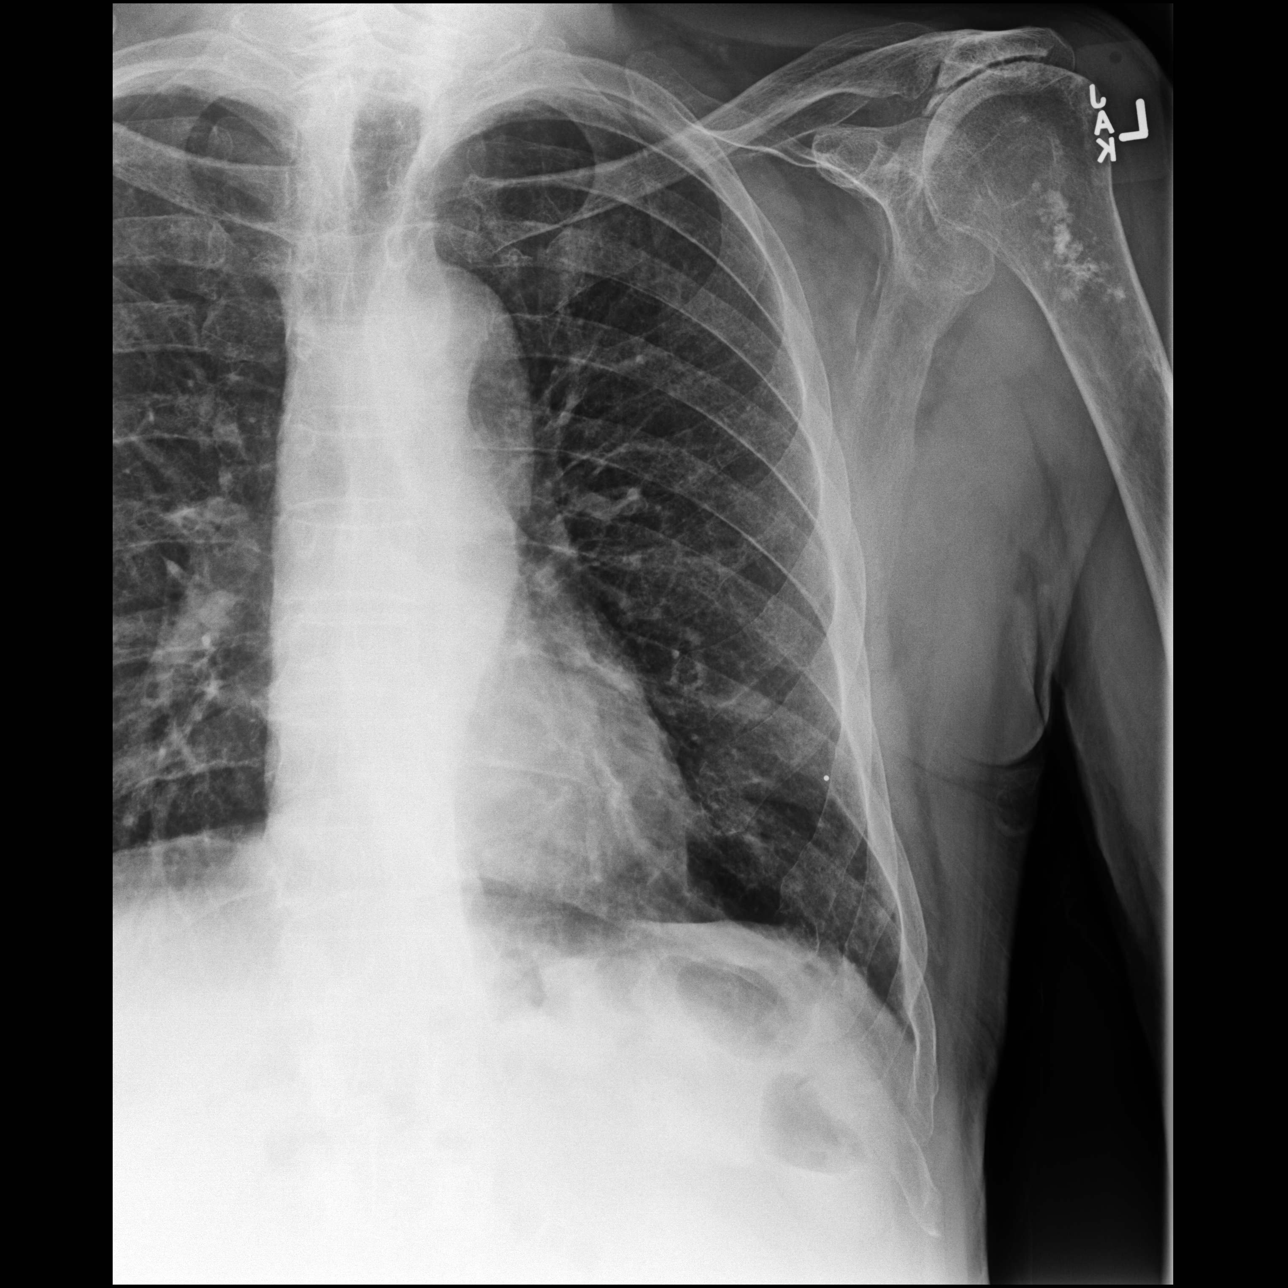

[dg ribs unilateral w/chest left (3 of 3)]
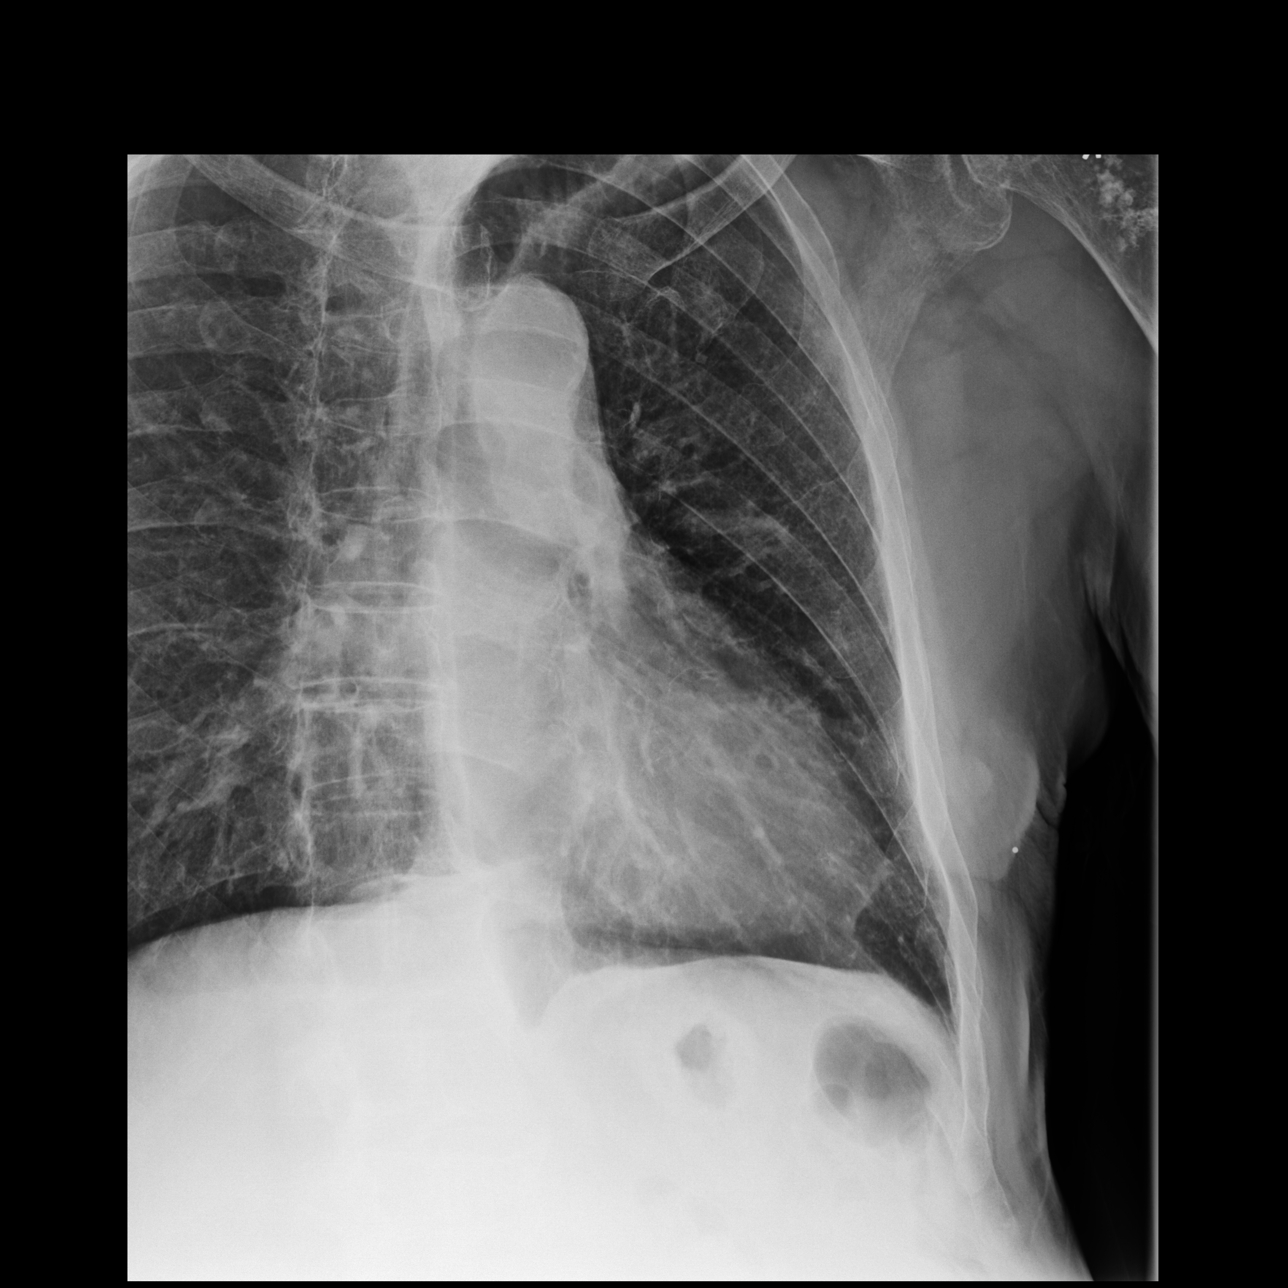

[3 of 3 positions shown; findings below may reference images not displayed]

FINDINGS: Heart size and vascularity normal. Lungs are clear. No infiltrate
effusion or pneumothorax

Mildly displaced fracture of the left eighth and tenth ribs
anteriorly.

Benign-appearing sclerotic lesion in the proximal humerus likely an
enchondroma
IMPRESSION: Mildly displaced fractures left eighth and tenth ribs

## 2018-04-10 DIAGNOSIS — J343 Hypertrophy of nasal turbinates: Secondary | ICD-10-CM | POA: Insufficient documentation

## 2018-04-10 DIAGNOSIS — J310 Chronic rhinitis: Secondary | ICD-10-CM | POA: Diagnosis not present

## 2018-04-10 DIAGNOSIS — T485X1A Poisoning by other anti-common-cold drugs, accidental (unintentional), initial encounter: Secondary | ICD-10-CM | POA: Diagnosis not present

## 2018-04-10 DIAGNOSIS — R0981 Nasal congestion: Secondary | ICD-10-CM | POA: Diagnosis not present

## 2018-04-28 ENCOUNTER — Other Ambulatory Visit (HOSPITAL_BASED_OUTPATIENT_CLINIC_OR_DEPARTMENT_OTHER): Payer: Self-pay

## 2018-04-28 DIAGNOSIS — G4733 Obstructive sleep apnea (adult) (pediatric): Secondary | ICD-10-CM

## 2018-05-02 DIAGNOSIS — N400 Enlarged prostate without lower urinary tract symptoms: Secondary | ICD-10-CM | POA: Diagnosis not present

## 2018-05-02 DIAGNOSIS — Z0000 Encounter for general adult medical examination without abnormal findings: Secondary | ICD-10-CM | POA: Diagnosis not present

## 2018-05-02 DIAGNOSIS — Z1389 Encounter for screening for other disorder: Secondary | ICD-10-CM | POA: Diagnosis not present

## 2018-05-02 DIAGNOSIS — E538 Deficiency of other specified B group vitamins: Secondary | ICD-10-CM | POA: Diagnosis not present

## 2018-05-02 DIAGNOSIS — M1990 Unspecified osteoarthritis, unspecified site: Secondary | ICD-10-CM | POA: Diagnosis not present

## 2018-05-02 DIAGNOSIS — E7800 Pure hypercholesterolemia, unspecified: Secondary | ICD-10-CM | POA: Diagnosis not present

## 2018-05-06 DIAGNOSIS — J343 Hypertrophy of nasal turbinates: Secondary | ICD-10-CM | POA: Diagnosis not present

## 2018-05-16 ENCOUNTER — Ambulatory Visit (HOSPITAL_BASED_OUTPATIENT_CLINIC_OR_DEPARTMENT_OTHER): Payer: Medicare Other | Attending: Internal Medicine | Admitting: Internal Medicine

## 2018-05-16 VITALS — Ht 66.0 in | Wt 148.0 lb

## 2018-05-16 DIAGNOSIS — G4733 Obstructive sleep apnea (adult) (pediatric): Secondary | ICD-10-CM | POA: Insufficient documentation

## 2018-05-16 DIAGNOSIS — G4730 Sleep apnea, unspecified: Secondary | ICD-10-CM

## 2018-05-27 NOTE — Procedures (Signed)
   NAME: John Sosa DATE OF BIRTH:  1929/04/07 MEDICAL RECORD NUMBER [id number]  LOCATION: [facility]  PHYSICIAN: Marius Ditch  DATE OF STUDY: 05/16/2018  SLEEP STUDY TYPE: Positive Airway Pressure Titration               REFERRING PHYSICIAN: Marius Ditch, MD  INDICATION FOR STUDY: Inadequate airway control with APAP 4-16  EPWORTH SLEEPINESS SCORE:  NA HEIGHT: 5\' 6"  (167.6 cm)  WEIGHT: 148 lb (67.1 kg)    Body mass index is 23.89 kg/m.  NECK SIZE: 15 in.  MEDICATIONS  Patient self administered medications include: N/A. Medications administered during study include No sleep medicine administered.Marland Kitchen   SLEEP STUDY TECHNIQUE  The patient underwent an attended overnight polysomnography titration to assess the effects of cpap therapy. The following variables were monitored: EEG (C4-A1, C3-A2, O1-A2, O2-A1, F3-M2, F4-M1), EOG, submental and leg EMG, ECG, oxyhemoglobin saturation by pulse oximetry, thoracic and abdominal respiratory effort belts, nasal/oral airflow by pressure sensor, body position sensor and snoring sensor. CPAP pressure was titrated to eliminate apneas, hypopneas and oxygen desaturation.   TECHNICAL COMMENTS  Comments added by Technician: ONE RESTROOM VISTED Comments added by Scorer: N/A  SLEEP ARCHITECTURE  The study was initiated at 9:43:08 PM and terminated at 4:20:05 AM. Total recorded time was 396.9 minutes. EEG confirmed total sleep time was 306 minutes yielding a sleep efficiency of 77.1%%. Sleep onset after lights out was 4.4 minutes with a REM latency of 128.0 minutes. The patient spent 6.5%% of the night in stage N1 sleep, 86.6%% in stage N2 sleep, 0.0%% in stage N3 and 6.86% in REM. The Arousal Index was 43.7/hour.   RESPIRATORY PARAMETERS  The patient was started on CPAP 4 and pressures increased due to continued events. When CPAP 16 was attained, the patient did not tolerate the pressure and was changed to BiPAP. The most  appropriate setting of BiPAP was IPAP/EPAP 20/16 cm H2O. At this settin, the AHI was 0.0 events per hour, and the RDI was 0.0 events/hour (with 0.0 central events) and the arousal index was 7.3 per hour.The oxygen nadir was 96.0% during sleep.  LEG MOVEMENT DATA  The total leg movements were 442 with a resulting leg movement index of 86.7. Associated arousal with leg movement index was 5.3.   CARDIAC DATA  The underlying cardiac rhythm was most consistent with sinus rhythm. Mean heart rate during sleep was 50.2 bpm. Additional rhythm abnormalities include PVCs.   IMPRESSIONS  - Intolerance to CPAP due to high pressures used.  - Adequate PAP titration with BiPAP 20/16.   DIAGNOSIS  - Obstructive Sleep Apnea (327.23 [G47.33 ICD-10])  RECOMMENDATIONS  - Trial of BiPAP therapy on 20/16 cm H2O with a Medium size Fisher[employer]Paykel Full Face Mask Simplus mask and heated humidification. - Appropriate ramp recommended.    Marius Ditch Sleep specialist American Board of Internal Medicine  ELECTRONICALLY SIGNED ON:  05/27/2018, 8:34 PM [place] PH: [phone]   FX: [phone] [place]

## 2018-06-11 DIAGNOSIS — R972 Elevated prostate specific antigen [PSA]: Secondary | ICD-10-CM | POA: Diagnosis not present

## 2018-07-11 DIAGNOSIS — L237 Allergic contact dermatitis due to plants, except food: Secondary | ICD-10-CM | POA: Diagnosis not present

## 2018-07-29 DIAGNOSIS — Z23 Encounter for immunization: Secondary | ICD-10-CM | POA: Diagnosis not present

## 2018-07-29 DIAGNOSIS — L247 Irritant contact dermatitis due to plants, except food: Secondary | ICD-10-CM | POA: Diagnosis not present

## 2018-09-03 ENCOUNTER — Encounter: Payer: Self-pay | Admitting: Cardiovascular Disease

## 2018-09-03 ENCOUNTER — Ambulatory Visit (INDEPENDENT_AMBULATORY_CARE_PROVIDER_SITE_OTHER): Payer: Medicare Other | Admitting: Cardiovascular Disease

## 2018-09-03 VITALS — BP 128/74 | HR 50 | Ht 66.0 in | Wt 147.8 lb

## 2018-09-03 DIAGNOSIS — E7800 Pure hypercholesterolemia, unspecified: Secondary | ICD-10-CM | POA: Diagnosis not present

## 2018-09-03 DIAGNOSIS — R001 Bradycardia, unspecified: Secondary | ICD-10-CM

## 2018-09-03 NOTE — Patient Instructions (Signed)

## 2018-09-03 NOTE — Progress Notes (Signed)
Chief Complaint  Patient presents with  . Follow-up    History of Present Illness: 84 yo male with history of GERD and HLD who is here today for cardiac follow up. He was evaluated in the [facility] ED 06/3015 after an episode of chest pain. Troponin was negative, EKG without ischemic changes. Stress myoview 07/20/15 with no ischemia. He has no known CAD. I also take care of his wife.   He is here today for follow up. The patient denies any chest pain, dyspnea, palpitations, lower extremity edema, orthopnea, PND, dizziness, near syncope or syncope. He is very active in the yard.   Primary Care Physician: Wenda Low, MD   Past Medical History:  Diagnosis Date  . GERD (gastroesophageal reflux disease)   . Hyperlipemia     Past Surgical History:  Procedure Laterality Date  . HEMORRHOID SURGERY  1950    Current Outpatient Medications  Medication Sig Dispense Refill  . B-D 3CC LUER-LOK SYR 25GX5/8" 25G X 5/8" 3 ML MISC See admin instructions.  1  . celecoxib (CELEBREX) 200 MG capsule Take 200 mg by mouth daily as needed for mild pain or moderate pain.   1  . Cholecalciferol (VITAMIN D3) 1000 units CAPS Take 1 capsule by mouth daily.    . Cyanocobalamin (VITAMIN B-12 IJ) Inject 1 mL as directed every 30 (thirty) days.    . finasteride (PROSCAR) 5 MG tablet Take 5 mg by mouth at bedtime.  3  . Multiple Vitamins-Minerals (PRESERVISION AREDS 2 PO) Take 1 tablet by mouth daily.    . simvastatin (ZOCOR) 10 MG tablet Take 10 mg by mouth daily.    Marland Kitchen triamcinolone (NASACORT ALLERGY 24HR) 55 MCG/ACT AERO nasal inhaler Place 2 sprays into the nose as needed.     No current facility-administered medications for this visit.     No Known Allergies  Social History   Socioeconomic History  . Marital status: Married    Spouse name: Not on file  . Number of children: 3  . Years of education: Not on file  . Highest education level: Not on file  Occupational History  . Occupation:  Retired-Textile  Social Needs  . Financial resource strain: Not on file  . Food insecurity:    Worry: Not on file    Inability: Not on file  . Transportation needs:    Medical: Not on file    Non-medical: Not on file  Tobacco Use  . Smoking status: Former Smoker    Last attempt to quit: 11/20/1956    Years since quitting: 61.8  . Smokeless tobacco: Never Used  Substance and Sexual Activity  . Alcohol use: No    Alcohol/week: 0.0 standard drinks  . Drug use: No  . Sexual activity: Not on file  Lifestyle  . Physical activity:    Days per week: Not on file    Minutes per session: Not on file  . Stress: Not on file  Relationships  . Social connections:    Talks on phone: Not on file    Gets together: Not on file    Attends religious service: Not on file    Active member of club or organization: Not on file    Attends meetings of clubs or organizations: Not on file    Relationship status: Not on file  . Intimate partner violence:    Fear of current or ex partner: Not on file    Emotionally abused: Not on file    Physically abused:  Not on file    Forced sexual activity: Not on file  Other Topics Concern  . Not on file  Social History Narrative  . Not on file    Family History  Problem Relation Age of Onset  . Diabetes Brother     Review of Systems:  As stated in the HPI and otherwise negative.   BP 128/74   Pulse (!) 50   Ht 5\' 6"  (1.676 m)   Wt 147 lb 12.8 oz (67 kg)   SpO2 98%   BMI 23.86 kg/m   Physical Examination:  General: Well developed, well nourished, NAD  HEENT: OP clear, mucus membranes moist  SKIN: warm, dry. No rashes. Neuro: No focal deficits  Musculoskeletal: Muscle strength 5/5 all ext  Psychiatric: Mood and affect normal  Neck: No JVD, no carotid bruits, no thyromegaly, no lymphadenopathy.  Lungs:Clear bilaterally, no wheezes, rhonci, crackles Cardiovascular: Regular rate and rhythm. No murmurs, gallops or rubs. Abdomen:Soft. Bowel sounds  present. Non-tender.  Extremities: No lower extremity edema. Pulses are 2 + in the bilateral DP/PT.  Echo 08/21/16: - Left ventricle: The cavity size was normal. Wall thickness was   normal. Systolic function was normal. The estimated ejection   fraction was in the range of 60% to 65%. Wall motion was normal;   there were no regional wall motion abnormalities. - Aortic valve: Mildly calcified annulus. There was trivial   regurgitation.  EKG:  EKG is  ordered today. The ekg ordered today demonstrates sinus brady, rate 50 bpm  Recent Labs: No results found for requested labs within last 8760 hours.   Lipid Panel No results found for: CHOL, TRIG, HDL, CHOLHDL, VLDL, LDLCALC, LDLDIRECT   Wt Readings from Last 3 Encounters:  09/03/18 147 lb 12.8 oz (67 kg)  05/16/18 148 lb (67.1 kg)  08/09/17 146 lb 12.8 oz (66.6 kg)     Other studies Reviewed: Additional studies/ records that were reviewed today include: . Review of the above records demonstrates:    Assessment and Plan:   1. Chest pain: No recent chest pain. His chest pain in the past has been felt to be due to GERD. Stress test in 2016 with no ischemia.    2. Hyperlipidemia: He is on a statin. Lipids followed in primary care.   3. Sinus bradycardia: No dizziness. Normal LV function by echo October 2017. He is on no rate controlling agents.   Current medicines are reviewed at length with the patient today.  The patient does not have concerns regarding medicines.  The following changes have been made:  no change  Labs/ tests ordered today include:   Orders Placed This Encounter  Procedures  . EKG 12-Lead    Disposition:   F/U with me in 12  months  Signed, Lauree Chandler, MD 09/03/2018 9:54 AM    [facility] [place] [address], [place], [place]  [address] Phone: [phone]; Fax: [phone]

## 2018-11-26 DIAGNOSIS — J309 Allergic rhinitis, unspecified: Secondary | ICD-10-CM | POA: Diagnosis not present

## 2018-11-26 DIAGNOSIS — J300 Vasomotor rhinitis: Secondary | ICD-10-CM | POA: Diagnosis not present

## 2018-12-15 DIAGNOSIS — J029 Acute pharyngitis, unspecified: Secondary | ICD-10-CM | POA: Diagnosis not present

## 2018-12-15 DIAGNOSIS — J310 Chronic rhinitis: Secondary | ICD-10-CM | POA: Diagnosis not present

## 2018-12-22 DIAGNOSIS — R0981 Nasal congestion: Secondary | ICD-10-CM | POA: Diagnosis not present

## 2019-03-27 DIAGNOSIS — H53001 Unspecified amblyopia, right eye: Secondary | ICD-10-CM | POA: Diagnosis not present

## 2019-03-27 DIAGNOSIS — H26491 Other secondary cataract, right eye: Secondary | ICD-10-CM | POA: Diagnosis not present

## 2019-03-27 DIAGNOSIS — H52203 Unspecified astigmatism, bilateral: Secondary | ICD-10-CM | POA: Diagnosis not present

## 2019-03-27 DIAGNOSIS — H353132 Nonexudative age-related macular degeneration, bilateral, intermediate dry stage: Secondary | ICD-10-CM | POA: Diagnosis not present

## 2019-06-27 DIAGNOSIS — R319 Hematuria, unspecified: Secondary | ICD-10-CM | POA: Diagnosis not present

## 2019-07-16 DIAGNOSIS — R972 Elevated prostate specific antigen [PSA]: Secondary | ICD-10-CM | POA: Diagnosis not present

## 2019-07-16 DIAGNOSIS — E7800 Pure hypercholesterolemia, unspecified: Secondary | ICD-10-CM | POA: Diagnosis not present

## 2019-07-16 DIAGNOSIS — N400 Enlarged prostate without lower urinary tract symptoms: Secondary | ICD-10-CM | POA: Diagnosis not present

## 2019-07-16 DIAGNOSIS — Z1389 Encounter for screening for other disorder: Secondary | ICD-10-CM | POA: Diagnosis not present

## 2019-07-16 DIAGNOSIS — Z23 Encounter for immunization: Secondary | ICD-10-CM | POA: Diagnosis not present

## 2019-07-16 DIAGNOSIS — E538 Deficiency of other specified B group vitamins: Secondary | ICD-10-CM | POA: Diagnosis not present

## 2019-07-16 DIAGNOSIS — Z0000 Encounter for general adult medical examination without abnormal findings: Secondary | ICD-10-CM | POA: Diagnosis not present

## 2019-07-16 DIAGNOSIS — M1990 Unspecified osteoarthritis, unspecified site: Secondary | ICD-10-CM | POA: Diagnosis not present

## 2019-07-16 DIAGNOSIS — J310 Chronic rhinitis: Secondary | ICD-10-CM | POA: Diagnosis not present

## 2019-09-02 NOTE — Progress Notes (Signed)
Chief Complaint  Patient presents with  . Follow-up    sinus bradycardia    History of Present Illness: 85 yo male with history of GERD and HLD who is here today for cardiac follow up. He was evaluated in the [facility] ED 06/3015 after an episode of chest pain. Troponin was negative, EKG without ischemic changes. Stress myoview 07/20/15 with no ischemia. He has no known CAD. I also take care of his wife.   He is here today for follow up. The patient denies any chest pain, dyspnea, palpitations, lower extremity edema, orthopnea, PND, dizziness, near syncope or syncope.    Primary Care Physician: Wenda Low, MD  Past Medical History:  Diagnosis Date  . GERD (gastroesophageal reflux disease)   . Hyperlipemia     Past Surgical History:  Procedure Laterality Date  . HEMORRHOID SURGERY  1950    Current Outpatient Medications  Medication Sig Dispense Refill  . B-D 3CC LUER-LOK SYR 25GX5/8" 25G X 5/8" 3 ML MISC See admin instructions.  1  . Cholecalciferol (VITAMIN D3) 1000 units CAPS Take 1 capsule by mouth daily.    . Cyanocobalamin (VITAMIN B-12 IJ) Inject 1 mL as directed every 30 (thirty) days.    . finasteride (PROSCAR) 5 MG tablet Take 5 mg by mouth at bedtime.  3  . Multiple Vitamins-Minerals (PRESERVISION AREDS 2 PO) Take 1 tablet by mouth daily.    . simvastatin (ZOCOR) 10 MG tablet Take 10 mg by mouth daily.    Marland Kitchen triamcinolone (NASACORT ALLERGY 24HR) 55 MCG/ACT AERO nasal inhaler Place 2 sprays into the nose as needed.     No current facility-administered medications for this visit.     No Known Allergies  Social History   Socioeconomic History  . Marital status: Married    Spouse name: Not on file  . Number of children: 3  . Years of education: Not on file  . Highest education level: Not on file  Occupational History  . Occupation: Retired-Textile  Social Needs  . Financial resource strain: Not on file  . Food insecurity    Worry: Not on file    Inability: Not  on file  . Transportation needs    Medical: Not on file    Non-medical: Not on file  Tobacco Use  . Smoking status: Former Smoker    Quit date: 11/20/1956    Years since quitting: 62.8  . Smokeless tobacco: Never Used  Substance and Sexual Activity  . Alcohol use: No    Alcohol/week: 0.0 standard drinks  . Drug use: No  . Sexual activity: Not on file  Lifestyle  . Physical activity    Days per week: Not on file    Minutes per session: Not on file  . Stress: Not on file  Relationships  . Social Herbalist on phone: Not on file    Gets together: Not on file    Attends religious service: Not on file    Active member of club or organization: Not on file    Attends meetings of clubs or organizations: Not on file    Relationship status: Not on file  . Intimate partner violence    Fear of current or ex partner: Not on file    Emotionally abused: Not on file    Physically abused: Not on file    Forced sexual activity: Not on file  Other Topics Concern  . Not on file  Social History Narrative  . Not  on file    Family History  Problem Relation Age of Onset  . Diabetes Brother     Review of Systems:  As stated in the HPI and otherwise negative.   BP 116/60   Pulse (!) 58   Ht 5\' 6"  (1.676 m)   Wt 141 lb 12.8 oz (64.3 kg)   SpO2 96%   BMI 22.89 kg/m   Physical Examination:  General: Well developed, well nourished, NAD  HEENT: OP clear, mucus membranes moist  SKIN: warm, dry. No rashes. Neuro: No focal deficits  Musculoskeletal: Muscle strength 5/5 all ext  Psychiatric: Mood and affect normal  Neck: No JVD, no carotid bruits, no thyromegaly, no lymphadenopathy.  Lungs:Clear bilaterally, no wheezes, rhonci, crackles Cardiovascular: Regular rate and rhythm. No murmurs, gallops or rubs. Abdomen:Soft. Bowel sounds present. Non-tender.  Extremities: No lower extremity edema. Pulses are 2 + in the bilateral DP/PT.  Echo 08/21/16: - Left ventricle: The cavity  size was normal. Wall thickness was   normal. Systolic function was normal. The estimated ejection   fraction was in the range of 60% to 65%. Wall motion was normal;   there were no regional wall motion abnormalities. - Aortic valve: Mildly calcified annulus. There was trivial   regurgitation.  EKG:  EKG is ordered today. The ekg ordered today demonstrates sinus brady, rate 58 bpmj.   Recent Labs: No results found for requested labs within last 8760 hours.   Lipid Panel No results found for: CHOL, TRIG, HDL, CHOLHDL, VLDL, LDLCALC, LDLDIRECT   Wt Readings from Last 3 Encounters:  09/03/19 141 lb 12.8 oz (64.3 kg)  09/03/18 147 lb 12.8 oz (67 kg)  05/16/18 148 lb (67.1 kg)     Other studies Reviewed: Additional studies/ records that were reviewed today include: . Review of the above records demonstrates:    Assessment and Plan:   1. Chest pain: He has no chest pain. His chest pain in the past has been felt to be due to GERD. Stress test in 2016 with no ischemia.    2. Hyperlipidemia: Lipids followed in primary care. Continue statin  3. Sinus bradycardia: Normal LV function by echo October 2017. He is on no rate controlling agents. No dizziness.   Current medicines are reviewed at length with the patient today.  The patient does not have concerns regarding medicines.  The following changes have been made:  no change  Labs/ tests ordered today include:   Orders Placed This Encounter  Procedures  . EKG 12-Lead    Disposition:   F/U with me in 12  months  Signed, Lauree Chandler, MD 09/03/2019 10:57 AM    [facility] [address] [place], [place], [place]  [address] Phone: [phone]; Fax: [phone]

## 2019-09-03 ENCOUNTER — Encounter: Payer: Self-pay | Admitting: Cardiovascular Disease

## 2019-09-03 ENCOUNTER — Other Ambulatory Visit: Payer: Self-pay

## 2019-09-03 ENCOUNTER — Ambulatory Visit (INDEPENDENT_AMBULATORY_CARE_PROVIDER_SITE_OTHER): Payer: Medicare Other | Admitting: Cardiovascular Disease

## 2019-09-03 VITALS — BP 116/60 | HR 58 | Ht 66.0 in | Wt 141.8 lb

## 2019-09-03 DIAGNOSIS — R001 Bradycardia, unspecified: Secondary | ICD-10-CM | POA: Diagnosis not present

## 2019-09-03 DIAGNOSIS — E7800 Pure hypercholesterolemia, unspecified: Secondary | ICD-10-CM | POA: Diagnosis not present

## 2019-09-03 NOTE — Patient Instructions (Signed)
Medication Instructions:  No change *If you need a refill on your cardiac medications before your next appointment, please call your pharmacy*  Lab Work: none If you have labs (blood work) drawn today and your tests are completely normal, you will receive your results only by: Marland Kitchen MyChart Message (if you have MyChart) OR . A paper copy in the mail If you have any lab test that is abnormal or we need to change your treatment, we will call you to review the results.  Testing/Procedures: none  Follow-Up: At [facility], you and your health needs are our priority.  As part of our continuing mission to provide you with exceptional heart care, we have created designated Provider Care Teams.  These Care Teams include your primary Cardiologist (physician) and Advanced Practice Providers (APPs -  Physician Assistants and Nurse Practitioners) who all work together to provide you with the care you need, when you need it.  Your next appointment:   12 months  The format for your next appointment:   In Person  Provider:   Lauree Chandler, MD  Other Instructions

## 2019-10-02 DIAGNOSIS — N400 Enlarged prostate without lower urinary tract symptoms: Secondary | ICD-10-CM | POA: Diagnosis not present

## 2019-11-02 ENCOUNTER — Ambulatory Visit: Payer: Medicare Other | Attending: Internal Medicine | Admitting: Audiology

## 2019-11-02 ENCOUNTER — Other Ambulatory Visit: Payer: Self-pay

## 2019-11-02 DIAGNOSIS — R292 Abnormal reflex: Secondary | ICD-10-CM | POA: Insufficient documentation

## 2019-11-02 DIAGNOSIS — H903 Sensorineural hearing loss, bilateral: Secondary | ICD-10-CM | POA: Diagnosis not present

## 2019-11-02 NOTE — Procedures (Signed)
Outpatient Audiology and [place]  [place], [place] [address]  [phone]   Audiological Evaluation  Patient Name: John Sosa   Status: Outpatient   DOB: 1929-02-07    Diagnosis: Unspecified Hearing Loss                 MRN: [id number] Date:  11/02/2019     Referent: Wenda Low, MD  History: John Sosa was seen for an audiological evaluation.  Primary Concern: Trouble hearing on the left side, for a while, maybe since in the [employer]". "My wife is complaining that I can't hear". 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 History of hypertension: N History of diabetes:  N  Evaluation: Conventional pure tone audiometry from 250Hz  - 8000Hz  with using insert earphones.  Hearing Thresholds: Right ear:  Thresholds of 30-35 dBHL from 250Hz  - 1000Hz ; 55 dBHL at 1500Hz ; 70 dBHL at 2000Hz ; 80 dBHL at 3000Hz ; 90-95 dBHL 4000Hz  - 6000Hz  at 80 dBHL at 8000Hz . Bone conduction shows a sensorineural hearing loss.  Left ear:    Thresholds of 40-45 dBHL from 250Hz  - 500Hz ; 60-65 dBHL from 750Hz  - 1500Hz ; 80 dBHL at 2000Hz ; 100-110dBHL at 3000Hz  - 4000Hz  and no response from 4000Hz  - 8000Hz . Reliability is good Speech reception levels (repeating words near threshold) using recorded spondee word lists:  Right ear: 50dBHL.  Left ear:  60dBHL Word recognition (at most comfortably loud volumes) using recorded PBK and [place] CNC word lists in quiet.  Right ear: 60% at 69 dBHLusing the [place] List #1    Using [place] List 1A 64%  Left ear:   64% at 80 dBHL using the [place] List  #3     Using [place] List 1A 68%  Tympanometry shows normal middle ear volume, pressure and compliance (Type A) with absent ipsilateral acoustic reflexes bilaterally from 500Hz  - 4000Hz .   Uncomfortable Loudness using monitored speech noise was 100dBHL on the right and 105dBHL on the left side which is consistent with recruitment.   CONCLUSION:      John Sosa a bilateral sensorineural hearing loss that ranges from moderate to moderately in the low and mid range and severe to profound in the high frequencies on the right side, which is the better hearing ear. The left ear is poorer and ranges from moderate to moderately severe in the low frequencies to profound in the high frequencies. Unusual was that John Sosa was unable to detect bone conduction tones on the left side, even with repositioning on the bone conductor; however this may be due to the severity of the left sided hearing loss. This amount of hearing adversely affects speech communication at even loud conversational speech levels spoken with 1 foot away.   Word recognition is consistently poor in each ear at very loud levels in each ear using both PBK word lists to get John Sosa comfortable with the task and also when using the [employer].  bilaterally. John Sosa states that the words "run together" and sometimes "were not clear". The test results were discussed and  John Sosa counseled. A hearing aid evaluation is strongly recommended. Since [place] is a [place] at hearing aid evaluation at [facility] (Tel [phone] ext [phone]) is strongly recommended.  Amplification helps make the signal louder and therefore often improves hearing and word recognition.  Amplification has many forms including hearing aids in one or both ears, an assistive listening device which have a microphone and speaker such as a small handheld device and/or even a surround sound system of speakers.  Amplification may be  covered by some insurances, but not all.  It is important to note that hearing aids must be individually fit according to the hearing test results and the ear shape.  Audiologists and hearing aid dealers in [place] must be licensed in order to dispense hearing aids.  In addition a trial period is mandated by law in our state because often amplification must be tried and then evaluated in order to determine benefit.        RECOMMENDATIONS: 1.   A hearing aid evaluation at the [facility].  If this is not possible, please request more local services such as [facility] in [place]. 2.  Strategies that help improve hearing include: A) Face the speaker directly. Optimal is having the speakers face well - lit.  Unless amplified, being within 3-6 feet of the speaker will enhance word recognition. B) Avoid having the speaker back-lit as this will minimize the ability to use cues from lip-reading, facial expression and gestures. C)  Word recognition is poorer in background noise. For optimal word recognition, turn off the TV, radio or noisy fan when engaging in conversation. In a restaurant, try to sit away from noise sources and close to the primary speaker.  D)  Ask for topic clarification from time to time in order to remain in the conversation.  Most people don't mind repeating or clarifying a point when asked.  If needed, explain the difficulty hearing in background noise or hearing loss. 3.  Use hearing protection during noisy activities such as using a weed eater, moving the lawn, shooting, etc.  Find hearing protection, such as sponge plugs (available at pharmacies) or earmuffs (available at sporting goods stores or department stores such as [employer]).   Chioke Noxon L. Heide Spark, Au.D., CCC-A Doctor of Audiology 11/02/2019   Cc:  [facility]         ASHLEE MERCER (Tel [phone])         [address]         [place],  [address]

## 2019-11-24 DIAGNOSIS — Z23 Encounter for immunization: Secondary | ICD-10-CM | POA: Diagnosis not present

## 2019-12-05 ENCOUNTER — Ambulatory Visit: Payer: Medicare Other

## 2019-12-12 ENCOUNTER — Encounter (HOSPITAL_COMMUNITY): Payer: Self-pay | Admitting: Emergency Medicine

## 2019-12-12 ENCOUNTER — Observation Stay (HOSPITAL_COMMUNITY)
Admission: EM | Admit: 2019-12-12 | Discharge: 2019-12-13 | Disposition: A | Discharge status: Home / Self Care | Payer: Medicare Other | Attending: Family Medicine | Admitting: Family Medicine

## 2019-12-12 ENCOUNTER — Emergency Department (HOSPITAL_COMMUNITY): Payer: Medicare Other

## 2019-12-12 ENCOUNTER — Other Ambulatory Visit: Payer: Self-pay

## 2019-12-12 DIAGNOSIS — R0789 Other chest pain: Principal | ICD-10-CM | POA: Insufficient documentation

## 2019-12-12 DIAGNOSIS — I10 Essential (primary) hypertension: Secondary | ICD-10-CM | POA: Diagnosis not present

## 2019-12-12 DIAGNOSIS — Z87891 Personal history of nicotine dependence: Secondary | ICD-10-CM | POA: Diagnosis not present

## 2019-12-12 DIAGNOSIS — R001 Bradycardia, unspecified: Secondary | ICD-10-CM | POA: Diagnosis not present

## 2019-12-12 DIAGNOSIS — R079 Chest pain, unspecified: Secondary | ICD-10-CM | POA: Diagnosis not present

## 2019-12-12 DIAGNOSIS — R0902 Hypoxemia: Secondary | ICD-10-CM | POA: Diagnosis not present

## 2019-12-12 DIAGNOSIS — Z20822 Contact with and (suspected) exposure to covid-19: Secondary | ICD-10-CM | POA: Diagnosis not present

## 2019-12-12 LAB — LIPID PANEL
Cholesterol: 171 mg/dL (ref 0–200)
HDL: 45 mg/dL (ref >40)
LDL Cholesterol: 111 mg/dL — ABNORMAL HIGH (ref 0–99)
Total CHOL/HDL Ratio: 3.8 RATIO
Triglycerides: 76 mg/dL (ref <150)
VLDL: 15 mg/dL (ref 0–40)

## 2019-12-12 LAB — CBC WITH DIFFERENTIAL/PLATELET
Abs Immature Granulocytes: 0.02 10*3/uL (ref 0.00–0.07)
Basophils Absolute: 0.1 10*3/uL (ref 0.0–0.1)
Basophils Relative: 2 %
Eosinophils Absolute: 0.5 10*3/uL (ref 0.0–0.5)
Eosinophils Relative: 7 %
HCT: 39.5 % (ref 39.0–52.0)
Hemoglobin: 12.8 g/dL — ABNORMAL LOW (ref 13.0–17.0)
Immature Granulocytes: 0 %
Lymphocytes Relative: 26 %
Lymphs Abs: 2 10*3/uL (ref 0.7–4.0)
MCH: 29.8 pg (ref 26.0–34.0)
MCHC: 32.4 g/dL (ref 30.0–36.0)
MCV: 92.1 fL (ref 80.0–100.0)
Monocytes Absolute: 0.7 10*3/uL (ref 0.1–1.0)
Monocytes Relative: 9 %
Neutro Abs: 4.4 10*3/uL (ref 1.7–7.7)
Neutrophils Relative %: 56 %
Platelets: 151 10*3/uL (ref 150–400)
RBC: 4.29 MIL/uL (ref 4.22–5.81)
RDW: 12.5 % (ref 11.5–15.5)
WBC: 7.7 10*3/uL (ref 4.0–10.5)
nRBC: 0 % (ref 0.0–0.2)

## 2019-12-12 LAB — BASIC METABOLIC PANEL
Anion gap: 10 (ref 5–15)
BUN: 17 mg/dL (ref 8–23)
CO2: 24 mmol/L (ref 22–32)
Calcium: 8.8 mg/dL — ABNORMAL LOW (ref 8.9–10.3)
Chloride: 104 mmol/L (ref 98–111)
Creatinine, Ser: 1.07 mg/dL (ref 0.61–1.24)
GFR calc Af Amer: >60 mL/min (ref >60)
GFR calc non Af Amer: >60 mL/min (ref >60)
Glucose, Bld: 97 mg/dL (ref 70–99)
Potassium: 3.8 mmol/L (ref 3.5–5.1)
Sodium: 138 mmol/L (ref 135–145)

## 2019-12-12 LAB — HEPATIC FUNCTION PANEL
ALT: 13 U/L (ref 0–44)
AST: 20 U/L (ref 15–41)
Albumin: 3.8 g/dL (ref 3.5–5.0)
Alkaline Phosphatase: 78 U/L (ref 38–126)
Bilirubin, Direct: <0.1 mg/dL (ref 0.0–0.2)
Total Bilirubin: 0.5 mg/dL (ref 0.3–1.2)
Total Protein: 7.3 g/dL (ref 6.5–8.1)

## 2019-12-12 LAB — D-DIMER, QUANTITATIVE: D-Dimer, Quant: 0.34 ug{FEU}/mL (ref 0.00–0.50)

## 2019-12-12 LAB — TROPONIN I (HIGH SENSITIVITY)
Troponin I (High Sensitivity): 4 ng/L (ref <18)
Troponin I (High Sensitivity): 4 ng/L (ref <18)

## 2019-12-12 LAB — TRIGLYCERIDES: Triglycerides: 94 mg/dL (ref <150)

## 2019-12-12 LAB — RESPIRATORY PANEL BY RT PCR (FLU A&B, COVID)
Influenza A by PCR: NEGATIVE
Influenza B by PCR: NEGATIVE
SARS Coronavirus 2 by RT PCR: NEGATIVE

## 2019-12-12 LAB — FERRITIN: Ferritin: 34 ng/mL (ref 24–336)

## 2019-12-12 LAB — LACTIC ACID, PLASMA
Lactic Acid, Venous: 1.7 mmol/L (ref 0.5–1.9)
Lactic Acid, Venous: 1.5 mmol/L (ref 0.5–1.9)

## 2019-12-12 LAB — PROCALCITONIN: Procalcitonin: <0.1 ng/mL

## 2019-12-12 LAB — LACTATE DEHYDROGENASE: LDH: 149 U/L (ref 98–192)

## 2019-12-12 LAB — C-REACTIVE PROTEIN: CRP: 0.5 mg/dL

## 2019-12-12 LAB — FIBRINOGEN: Fibrinogen: 286 mg/dL (ref 210–475)

## 2019-12-12 MED ORDER — ONDANSETRON HCL 4 MG/2ML IJ SOLN: 4.0000 mg | Freq: Four times a day (QID) | INTRAMUSCULAR | Status: DC | PRN

## 2019-12-12 MED ORDER — HEPARIN SODIUM (PORCINE) 5000 UNIT/ML IJ SOLN
5000.0000 [IU] | Freq: Two times a day (BID) | INTRAMUSCULAR | Status: DC
  Administered 2019-12-12 – 2019-12-13 (×2): 5000 [IU] via SUBCUTANEOUS
  Filled 2019-12-12 (×2): qty 1

## 2019-12-12 MED ORDER — ACETAMINOPHEN 325 MG PO TABS: 650.0000 mg | ORAL_TABLET | ORAL | Status: DC | PRN

## 2019-12-12 MED ORDER — TRIAMCINOLONE ACETONIDE 55 MCG/ACT NA AERO
2.0000 | INHALATION_SPRAY | Freq: Every day | NASAL | Status: DC
  Administered 2019-12-13: 2 via NASAL
  Filled 2019-12-12: qty 21.6

## 2019-12-12 MED ORDER — ASPIRIN EC 81 MG PO TBEC
81.0000 mg | DELAYED_RELEASE_TABLET | Freq: Every day | ORAL | Status: DC
  Administered 2019-12-13: 81 mg via ORAL
  Filled 2019-12-12: qty 1

## 2019-12-12 MED ORDER — PANTOPRAZOLE SODIUM 40 MG PO TBEC
40.0000 mg | DELAYED_RELEASE_TABLET | Freq: Every day | ORAL | Status: DC
  Administered 2019-12-12 – 2019-12-13 (×2): 40 mg via ORAL
  Filled 2019-12-12 (×2): qty 1

## 2019-12-12 NOTE — ED Provider Notes (Signed)
[facility] EMERGENCY DEPARTMENT Provider Note   CSN: [id number] Arrival date & time: 12/12/19  [id number]     History Chief Complaint  Patient presents with  . Chest Pain    John Sosa is a 86 y.o. male history of GERD, hyperlipidemia, bradycardia.  Patient brought in today by EMS after an episode of chest pain.  Patient reports a moderate intensity left central chest tightness that lasted approximately 10 minutes, no clear inciting factors, no radiation of pain, pain gradually subsided without intervention.  Patient took 324 mg of aspirin and his wife called EMS.  He reports he is chest pain-free upon ED arrival.  Unclear if patient took double dose 324 mg aspirin, patient only recalls taking 1 dose.  He denies any recent illness, fever/chills, headache, shortness of breath, cough, abdominal pain, nausea/vomiting, diarrhea, extremity swelling/color change or any additional concerns.  He reports that he is feeling well at this time.  HPI     Past Medical History:  Diagnosis Date  . GERD (gastroesophageal reflux disease)   . Hyperlipemia     Patient Active Problem List   Diagnosis Date Noted  . Chest pain 07/13/2015  . GERD (gastroesophageal reflux disease) 07/13/2015  . BPH (benign prostatic hypertrophy) 07/13/2015  . Sinus bradycardia on ECG 07/13/2015  . Hyperlipemia     Past Surgical History:  Procedure Laterality Date  . HEMORRHOID SURGERY  1950       Family History  Problem Relation Age of Onset  . Diabetes Brother     Social History   Tobacco Use  . Smoking status: Former Smoker    Quit date: 11/20/1956    Years since quitting: 63.1  . Smokeless tobacco: Never Used  Substance Use Topics  . Alcohol use: No    Alcohol/week: 0.0 standard drinks  . Drug use: No    Home Medications Prior to Admission medications   Medication Sig Start Date End Date Taking? Authorizing Provider  triamcinolone (NASACORT ALLERGY 24HR) 55 MCG/ACT AERO  nasal inhaler Place 2 sprays into the nose as needed.   Yes [provider]    Allergies    Patient has no known allergies.  Review of Systems   Review of Systems Ten systems are reviewed and are negative for acute change except as noted in the HPI  Physical Exam Updated Vital Signs BP 133/81   Pulse (!) 51   Temp 98.4 F (36.9 C)   Resp 13   SpO2 100%   Physical Exam Constitutional:      General: He is not in acute distress.    Appearance: Normal appearance. He is well-developed. He is not ill-appearing or diaphoretic.  HENT:     Head: Normocephalic and atraumatic.     Right Ear: External ear normal.     Left Ear: External ear normal.     Nose: Nose normal.  Eyes:     General: Vision grossly intact. Gaze aligned appropriately.     Pupils: Pupils are equal, round, and reactive to light.  Neck:     Trachea: Trachea and phonation normal. No tracheal deviation.  Cardiovascular:     Rate and Rhythm: Normal rate and regular rhythm.     Pulses:          Dorsalis pedis pulses are 2+ on the right side and 2+ on the left side.     Heart sounds: Normal heart sounds.  Pulmonary:     Effort: Pulmonary effort is normal. No respiratory  distress.     Breath sounds: Normal breath sounds.  Abdominal:     General: There is no distension.     Palpations: Abdomen is soft.     Tenderness: There is no abdominal tenderness. There is no guarding or rebound.  Musculoskeletal:        General: Normal range of motion.     Cervical back: Normal range of motion.     Right lower leg: No tenderness. No edema.     Left lower leg: No tenderness. No edema.  Skin:    General: Skin is warm and dry.  Neurological:     Mental Status: He is alert.     GCS: GCS eye subscore is 4. GCS verbal subscore is 5. GCS motor subscore is 6.     Comments: Speech is clear and goal oriented, follows commands Major Cranial nerves without deficit, no facial droop Moves extremities without ataxia,  coordination intact  Psychiatric:        Behavior: Behavior normal.     ED Results / Procedures / Treatments   Labs (all labs ordered are listed, but only abnormal results are displayed) Labs Reviewed  BASIC METABOLIC PANEL - Abnormal; Notable for the following components:      Result Value   Calcium 8.8 (*)    All other components within normal limits  CBC WITH DIFFERENTIAL/PLATELET - Abnormal; Notable for the following components:   Hemoglobin 12.8 (*)    All other components within normal limits  RESPIRATORY PANEL BY RT PCR (FLU A&B, COVID)  CULTURE, BLOOD (ROUTINE X 2)  CULTURE, BLOOD (ROUTINE X 2)  LACTIC ACID, PLASMA  LACTIC ACID, PLASMA  D-DIMER, QUANTITATIVE (NOT AT [facility])  PROCALCITONIN  LACTATE DEHYDROGENASE  FERRITIN  FIBRINOGEN  C-REACTIVE PROTEIN  HEPATIC FUNCTION PANEL  TRIGLYCERIDES  LIPID PANEL  TROPONIN I (HIGH SENSITIVITY)  TROPONIN I (HIGH SENSITIVITY)    EKG EKG Interpretation  Date/Time:  Saturday December 12 2019 08:37:27 EST Ventricular Rate:  58 PR Interval:    QRS Duration: 100 QT Interval:  433 QTC Calculation: 426 R Axis:   36 Text Interpretation: Normal sinus rhythm Normal ECG SINCE LAST TRACING HEART RATE HAS INCREASED Confirmed by Pattricia Boss [phone]) on 12/12/2019 9:08:41 AM   Radiology DG Chest Port 1 View  Result Date: 12/12/2019 CLINICAL DATA:  Central chest pain radiating to the back. EXAM: PORTABLE CHEST 1 VIEW COMPARISON:  Chest radiograph 02/21/2017 FINDINGS: Monitoring leads overlie the patient. Stable cardiac and mediastinal contours. Mild bilateral interstitial opacities predominately within the mid and lower lungs bilaterally. Thoracic spine degenerative changes. IMPRESSION: Bilateral mid lower lung interstitial opacities may represent edema or atypical infection. Electronically Signed   By: Lovey Newcomer M.D.   On: 12/12/2019 09:05    Procedures Procedures (including critical care time)  Medications Ordered in ED  Medications  triamcinolone (NASACORT) nasal inhaler 2 spray (has no administration in time range)  acetaminophen (TYLENOL) tablet 650 mg (has no administration in time range)  ondansetron (ZOFRAN) injection 4 mg (has no administration in time range)  heparin injection 5,000 Units (has no administration in time range)  aspirin EC tablet 81 mg (has no administration in time range)    ED Course  I have reviewed the triage vital signs and the nursing notes.  Pertinent labs & imaging results that were available during my care of the patient were reviewed by me and considered in my medical decision making (see chart for details).  Clinical Course as of Jan 30  [id number]  Sat Dec 12, 2019  1214 Dr. Roosevelt Locks   [BM]    Clinical Course User Index [BM] Gari Crown   MDM Rules/Calculators/A&P                     86 year old male with history of GERD and hyperlipidemia presents today after a 10-minute episode of chest pain.  He describes a left central nonradiating chest tightness that gradually resolved on its own.  He is chest pain-free upon arrival.  It is unclear at this point if patient had 1 or 2 doses of 324 mg aspirin prior to arrival.  He reports he is currently feeling well and has no complaints.  He denies any shortness of breath, abdominal pain, nausea or vomiting associated with his chest pain.  Chart review reveals no significant recent cardiovascular evaluation, he had a echocardiogram performed in October 2017 which showed normal left ventricular systolic function and an ejection fraction of 60-65%, additionally mildly calcified annulus of the aortic valve and trivial regurgitation.  Based on patient's age and symptoms today will obtain chest pain work-up. - Initial lab work of CBC, BMP and high-sensitivity troponin unremarkable high-sensitivity troponin of 4.  Chest x-ray is concerning for possible edema versus infiltrates.  Patient does not appear fluid overloaded, concerned this  may be a possible early coronavirus infection will obtain testing in order set.  Plan for admission at this time heart score of 4.  On reevaluation patient resting comfortably no acute distress. - Covid test and inflammatory markers are within normal limits. Case was discussed with hospitalist Dr. Sherrye Payor who has accepted patient to his service.  Patient was seen and evaluated by Dr. Jeanell Sparrow during this visit who agrees with plan of care.   John Sosa was evaluated in Emergency Department on 12/12/2019 for the symptoms described in the history of present illness. He was evaluated in the context of the global COVID-19 pandemic, which necessitated consideration that the patient might be at risk for infection with the SARS-CoV-2 virus that causes COVID-19. Institutional protocols and algorithms that pertain to the evaluation of patients at risk for COVID-19 are in a state of rapid change based on information released by regulatory bodies including the CDC and federal and state organizations. These policies and algorithms were followed during the patient's care in the ED.  Note: Portions of this report may have been transcribed using voice recognition software. Every effort was made to ensure accuracy; however, inadvertent computerized transcription errors may still be present. Final Clinical Impression(s) / ED Diagnoses Final diagnoses:  Chest pain, unspecified type    Rx / DC Orders ED Discharge Orders    None       Gari Crown 12/12/19 1317    Pattricia Boss, MD 12/13/19 [phone]

## 2019-12-12 NOTE — ED Notes (Signed)
Pts wife was called & updated at his request. John Sosa [phone]

## 2019-12-12 NOTE — ED Notes (Signed)
Wife was called & informed about pts room number and the results of his Covid swab being negative.

## 2019-12-12 NOTE — ED Triage Notes (Signed)
Pt [facility] from home. Pt complaint of central chest pain that radiated to his back and that lasted approx. 10 min this morning. Pt took his daily dose of 324 aspirin and when calling EMS was instructed to take 324 more. Pt pain free upon arrival. Pt feels as if this may just be indigestion. VSS. NAD.

## 2019-12-12 NOTE — ED Notes (Signed)
Report called to Tamala Bari RN

## 2019-12-12 NOTE — Consult Note (Signed)
Cardiology Consultation:   Patient ID: John Sosa MRN: [id number]; DOB: 01/01/1929  Admit date: 12/12/2019 Date of Consult: 12/12/2019  Primary Care Provider: Wenda Low, MD Primary Cardiologist: Lauree Chandler, MD     Patient Profile:   John Sosa is a 86 y.o. male with a hx of  Bradycardia who is being seen today for the evaluation of chest  at the request of Dr Markus Jarvis  .  History of Present Illness:   John Sosa is an 86 yo with hx of sinus bradycardia, HL, GERD who has a hx of CP in past   Stress myovue in 2016 showed no ischemia.   He follows with [facility]   Last seen in Oct 2020    Pt a somewhat difficult historian     Today he woke up   Had breakfast   Just standing when he developed chest discomfort   L sided   Pressure  Not severe    Not pleuritic or position    Lasted about 2 minutes and then went away    No recurrence  Took aspirin.     Wonders if it may have been indigestion.   Past couple days he has been burping up some acid  No other CP   No SOB   No dizzienss   .   Heart Pathway Score:     Past Medical History:  Diagnosis Date  . GERD (gastroesophageal reflux disease)   . Hyperlipemia     Past Surgical History:  Procedure Laterality Date  . HEMORRHOID SURGERY  1950       Inpatient Medications: Scheduled Meds: . [START ON 12/13/2019] aspirin EC  81 mg Oral Daily  . heparin  5,000 Units Subcutaneous Q12H  . [START ON 12/13/2019] triamcinolone  2 spray Nasal Daily   Continuous Infusions:  PRN Meds: acetaminophen, ondansetron (ZOFRAN) IV  Allergies:   No Known Allergies  Social History:   Social History   Socioeconomic History  . Marital status: Married    Spouse name: Not on file  . Number of children: 3  . Years of education: Not on file  . Highest education level: Not on file  Occupational History  . Occupation: Retired-Textile  Tobacco Use  . Smoking status: Former Smoker    Quit date: 11/20/1956    Years since  quitting: 63.1  . Smokeless tobacco: Never Used  Substance and Sexual Activity  . Alcohol use: No    Alcohol/week: 0.0 standard drinks  . Drug use: No  . Sexual activity: Not on file  Other Topics Concern  . Not on file  Social History Narrative  . Not on file   Social Determinants of Health   Financial Resource Strain:   . Difficulty of Paying Living Expenses: Not on file  Food Insecurity:   . Worried About Charity fundraiser in the Last Year: Not on file  . Ran Out of Food in the Last Year: Not on file  Transportation Needs:   . Lack of Transportation (Medical): Not on file  . Lack of Transportation (Non-Medical): Not on file  Physical Activity:   . Days of Exercise per Week: Not on file  . Minutes of Exercise per Session: Not on file  Stress:   . Feeling of Stress : Not on file  Social Connections:   . Frequency of Communication with Friends and Family: Not on file  . Frequency of Social Gatherings with Friends and Family: Not on file  .  Attends Religious Services: Not on file  . Active Member of Clubs or Organizations: Not on file  . Attends Archivist Meetings: Not on file  . Marital Status: Not on file  Intimate Partner Violence:   . Fear of Current or Ex-Partner: Not on file  . Emotionally Abused: Not on file  . Physically Abused: Not on file  . Sexually Abused: Not on file    Family History:    Family History  Problem Relation Age of Onset  . Diabetes Brother      ROS:  Please see the history of present illness.   All other ROS reviewed and negative.     Physical Exam/Data:   Vitals:   12/12/19 1215 12/12/19 1245 12/12/19 1300 12/12/19 1315  BP: 133/81 (!) 121/96 (!) 136/114 116/90  Pulse: (!) 51 (!) 55 71 (!) 50  Resp: 13 18 15 13   Temp:      SpO2: 100% (!) 88% (!) 72% 99%   No intake or output data in the 24 hours ending 12/12/19 1351 Last 3 Weights 09/03/2019 09/03/2018 05/16/2018  Weight (lbs) 141 lb 12.8 oz 147 lb 12.8 oz 148 lb    Weight (kg) 64.32 kg 67.042 kg 67.132 kg     There is no height or weight on file to calculate BMI.  General:  Well nourished, well developed, in no acute distress HEENT: normal Lymph: no adenopathy Neck: no JVD  No bruits   Endocrine:  No thryomegaly Vascular: No carotid bruits; FA pulses 2+ bilaterally without bruits  Cardiac:  normal S1, S2; RRR; no murmur  Lungs:  clear to auscultation bilaterally, no wheezing, rhonchi or rales  Abd: soft, nontender, no hepatomegaly  Ext: no edema Musculoskeletal:  No deformities, BUE and BLE strength normal and equal Skin: warm and dry  Neuro:  CNs 2-12 intact, no focal abnormalities noted Psych:  Normal affect   EKG:  The EKG was personally reviewed and demonstrates:  Sinus bradycardia  58 bpm   Telemetry:  Telemetry was personally reviewed and demonstrates:    SR   Relevant CV Studies: Echo ordered   Laboratory Data:  High Sensitivity Troponin:   Recent Labs  Lab 12/12/19 0840 12/12/19 1040  TROPONINIHS 4 4     Chemistry Recent Labs  Lab 12/12/19 0840  NA 138  K 3.8  CL 104  CO2 24  GLUCOSE 97  BUN 17  CREATININE 1.07  CALCIUM 8.8*  GFRNONAA >60  GFRAA >60  ANIONGAP 10    Recent Labs  Lab 12/12/19 0920  PROT 7.3  ALBUMIN 3.8  AST 20  ALT 13  ALKPHOS 78  BILITOT 0.5   Hematology Recent Labs  Lab 12/12/19 0841  WBC 7.7  RBC 4.29  HGB 12.8*  HCT 39.5  MCV 92.1  MCH 29.8  MCHC 32.4  RDW 12.5  PLT 151   BNPNo results for input(s): BNP, PROBNP in the last 168 hours.  DDimer  Recent Labs  Lab 12/12/19 0920  DDIMER 0.34     Radiology/Studies:  DG Chest Port 1 View  Result Date: 12/12/2019 CLINICAL DATA:  Central chest pain radiating to the back. EXAM: PORTABLE CHEST 1 VIEW COMPARISON:  Chest radiograph 02/21/2017 FINDINGS: Monitoring leads overlie the patient. Stable cardiac and mediastinal contours. Mild bilateral interstitial opacities predominately within the mid and lower lungs bilaterally.  Thoracic spine degenerative changes. IMPRESSION: Bilateral mid lower lung interstitial opacities may represent edema or atypical infection. Electronically Signed   By: Dian Situ  Rosana Hoes M.D.   On: 12/12/2019 09:05       HEAR Score (for undifferentiated chest pain):       Assessment and Plan:   1  Chest pain   I am not convinced chest pain represents angina/coronary ischemia  ? GI   EKG nondiagonsitic    Would cycle troponins    Treat empically with PPI  2 Hx sinus bradycardia   Denies dizzienss  3  GI   As above  REcent belching, acid /bitter taste   Rx empirically with PPI  4  Hx HL    LDL 111    If tropinins neg would ambulat  I would not plan further testing if r/o for MI and no further pain      For questions or updates, please contact [facility] Please consult [email or web address] for contact info under     Signed, Dorris Carnes, MD  12/12/2019 1:51 PM

## 2019-12-12 NOTE — H&P (Signed)
History and Physical    John Sosa [id number] DOB: May 17, 1929 DOA: 12/12/2019  PCP: Wenda Low, MD   Patient coming from: Home  I have personally briefly reviewed patient's old medical records in [place]  Chief Complaint: Chest pain  HPI: John Sosa is a 86 y.o. male with medical history significant of sinus bradycardia, recurrent chest pains, scented with chest pain.  Symptom started this morning, he woke up and started to feel pressure/sore like chest pain retrosternal, nonradiating, 5-6 over 10, without significant other symptoms such as nauseous vomit, palpitation, sweating, or short of breath. No feeling of lightheadness or blurry vision. No significant relieving or exacerbating factors.  He did take 1 aspirin for the chest pain, and the chest pain subsided after 3 to 5 minutes.  He was seen by cardiologist October last year for chest pain, his cardiologist see the chest pain at that time point was probably related to GERD.  The patient told me he does not have any GERD symptoms, and he said the feature of chest pain this time is somewhat different.  ED Course: EKG showed no significant ST-T changes, troponin negative, D-dimer negative.  X-ray showed questionable lung vasculature congestion.  Review of Systems: As per HPI otherwise 10 point review of systems negative.    Past Medical History:  Diagnosis Date  . GERD (gastroesophageal reflux disease)   . Hyperlipemia     Past Surgical History:  Procedure Laterality Date  . [place]     reports that he quit smoking about 63 years ago. He has never used smokeless tobacco. He reports that he does not drink alcohol or use drugs.  No Known Allergies  Family History  Problem Relation Age of Onset  . Diabetes Brother      Prior to Admission medications   Medication Sig Start Date End Date Taking? Authorizing Provider  triamcinolone (NASACORT ALLERGY 24HR) 55 MCG/ACT AERO nasal  inhaler Place 2 sprays into the nose as needed.   Yes [provider]    Physical Exam: Vitals:   12/12/19 1130 12/12/19 1145 12/12/19 1200 12/12/19 1215  BP: 120/73 131/84 113/84 133/81  Pulse: (!) 29 (!) 55 (!) 52 (!) 51  Resp: 11 17 (!) 9 13  Temp:      SpO2: 100% 100% 100% 100%    Constitutional: NAD, calm, comfortable Vitals:   12/12/19 1130 12/12/19 1145 12/12/19 1200 12/12/19 1215  BP: 120/73 131/84 113/84 133/81  Pulse: (!) 29 (!) 55 (!) 52 (!) 51  Resp: 11 17 (!) 9 13  Temp:      SpO2: 100% 100% 100% 100%   Eyes: PERRL, lids and conjunctivae normal ENMT: Mucous membranes are moist. Posterior pharynx clear of any exudate or lesions.Normal dentition.  Neck: normal, supple, no masses, no thyromegaly Respiratory: clear to auscultation bilaterally, no wheezing, no crackles. Normal respiratory effort. No accessory muscle use.  Cardiovascular: Regular rate and rhythm, no murmurs / rubs / gallops. No extremity edema. 2+ pedal pulses. No carotid bruits.  Abdomen: no tenderness, no masses palpated. No hepatosplenomegaly. Bowel sounds positive.  Musculoskeletal: no clubbing / cyanosis. No joint deformity upper and lower extremities. Good ROM, no contractures. Normal muscle tone.  Skin: no rashes, lesions, ulcers. No induration Neurologic: CN 2-12 grossly intact. Sensation intact, DTR normal. Strength 5/5 in all 4.  Psychiatric: Normal judgment and insight. Alert and oriented x 3. Normal mood.     Labs on Admission: I have personally reviewed following  labs and imaging studies  CBC: Recent Labs  Lab 12/12/19 0841  WBC 7.7  NEUTROABS 4.4  HGB 12.8*  HCT 39.5  MCV 92.1  PLT 123XX123   Basic Metabolic Panel: Recent Labs  Lab 12/12/19 0840  NA 138  K 3.8  CL 104  CO2 24  GLUCOSE 97  BUN 17  CREATININE 1.07  CALCIUM 8.8*   GFR: CrCl cannot be calculated (Unknown ideal weight.). Liver Function Tests: Recent Labs  Lab 12/12/19 0920  AST 20  ALT 13    ALKPHOS 78  BILITOT 0.5  PROT 7.3  ALBUMIN 3.8   No results for input(s): LIPASE, AMYLASE in the last 168 hours. No results for input(s): AMMONIA in the last 168 hours. Coagulation Profile: No results for input(s): INR, PROTIME in the last 168 hours. Cardiac Enzymes: No results for input(s): CKTOTAL, CKMB, CKMBINDEX, TROPONINI in the last 168 hours. BNP (last 3 results) No results for input(s): PROBNP in the last 8760 hours. HbA1C: No results for input(s): HGBA1C in the last 72 hours. CBG: No results for input(s): GLUCAP in the last 168 hours. Lipid Profile: Recent Labs    12/12/19 0920  TRIG 94   Thyroid Function Tests: No results for input(s): TSH, T4TOTAL, FREET4, T3FREE, THYROIDAB in the last 72 hours. Anemia Panel: Recent Labs    12/12/19 0920  FERRITIN 34   Urine analysis: No results found for: COLORURINE, APPEARANCEUR, LABSPEC, PHURINE, GLUCOSEU, HGBUR, BILIRUBINUR, KETONESUR, PROTEINUR, UROBILINOGEN, NITRITE, LEUKOCYTESUR  Radiological Exams on Admission: DG Chest Port 1 View  Result Date: 12/12/2019 CLINICAL DATA:  Central chest pain radiating to the back. EXAM: PORTABLE CHEST 1 VIEW COMPARISON:  Chest radiograph 02/21/2017 FINDINGS: Monitoring leads overlie the patient. Stable cardiac and mediastinal contours. Mild bilateral interstitial opacities predominately within the mid and lower lungs bilaterally. Thoracic spine degenerative changes. IMPRESSION: Bilateral mid lower lung interstitial opacities may represent edema or atypical infection. Electronically Signed   By: Lovey Newcomer M.D.   On: 12/12/2019 09:05    EKG: Independently reviewed.  No acute ST-T changes  Assessment/Plan Active Problems:   Chest pain  Chest pain, Rule out ACS. Cycle Tropnins Echo, consult Cardio for further workup, probably stress test. ASA, and check lipids panel  Sinus Bradycardia HR at his baseline, monitor overnight.   DVT prophylaxis: Heparin subQ Code Status: Full  Code as per pt Family Communication: None at bedside Disposition Plan: Home tomorrow if no further cardio workup Consults called: Cardio Admission status: Tele obs   Lequita Halt MD [facility] Pager [phone]  If 7PM-7AM, please contact night-coverage [email or web address] Password [email or web address]  12/12/2019, 1:09 PM

## 2019-12-13 DIAGNOSIS — R079 Chest pain, unspecified: Secondary | ICD-10-CM | POA: Diagnosis not present

## 2019-12-13 MED ORDER — OMEPRAZOLE 40 MG PO CPDR: 40.0000 mg | DELAYED_RELEASE_CAPSULE | Freq: Every day | ORAL | 0 refills | Status: AC

## 2019-12-13 NOTE — Discharge Summary (Addendum)
Physician Discharge Summary  BOEN MALATESTA [id number] DOB: 1929-09-22 DOA: 12/12/2019  PCP: Wenda Low, MD  Admit date: 12/12/2019 Discharge date: 12/13/2019  Admitted From: Home Disposition: Home  Recommendations for Outpatient Follow-up:  1. Follow up with PCP in 1-2 weeks 2. Follow with cardiology in clinic for recurrence of chest pain. 3. Please obtain BMP/CBC in one week 4. Please follow up on the following pending results:  Home Health: None Equipment/Devices: None  Discharge Condition: Stable CODE STATUS: Full code Diet recommendation: Cardiac  Subjective: Seen and examined.  No complaints.  Chest pain had resolved.  Wants to go home.  HPI: John Sosa is a 86 y.o. male with medical history significant of sinus bradycardia, recurrent chest pains, scented with chest pain.  Symptom started this morning, he woke up and started to feel pressure/sore like chest pain retrosternal, nonradiating, 5-6 over 10, without significant other symptoms such as nauseous vomit, palpitation, sweating, or short of breath. No feeling of lightheadness or blurry vision. No significant relieving or exacerbating factors.  He did take 1 aspirin for the chest pain, and the chest pain subsided after 3 to 5 minutes.  He was seen by cardiologist October last year for chest pain, his cardiologist see the chest pain at that time point was probably related to GERD.  The patient told me he does not have any GERD symptoms, and he said the feature of chest pain this time is somewhat different.  ED Course: EKG showed no significant ST-T changes, troponin negative, D-dimer negative.  X-ray showed questionable lung vasculature congestion.  Brief/Interim Summary: Patient was admitted under hospital service for further work-up of chest pain.  Cardiology was consulted.  Cardiac enzymes were negative.  Patient chest pain resolved.  No EKG changes.  Cardiology cleared the patient to go home.  He is doing fine  and wants to go home and he will be discharged in stable condition.  Of note, his chest pain could very well be due to GERD so he is being prescribed omeprazole 40 mg p.o. daily for 30 days.  Discharge Diagnoses:  Active Problems:   Chest pain    Discharge Instructions  Discharge Instructions    Discharge patient   Complete by: As directed    Discharge disposition: 01-Home or Self Care   Discharge patient date: 12/13/2019     Allergies as of 12/13/2019   No Known Allergies     Medication List    TAKE these medications   Nasacort Allergy 24HR 55 MCG/ACT Aero nasal inhaler Generic drug: triamcinolone Place 2 sprays into the nose as needed.   omeprazole 40 MG capsule Commonly known as: PRILOSEC Take 1 capsule (40 mg total) by mouth daily.      Follow-up Information    Wenda Low, MD Follow up in 1 week(s).   Specialty: Internal Medicine Contact information: [address] [place] [address] [phone]        Burnell Blanks, MD .   Specialty: Cardiology Contact information: [address] [place] [address] [phone]          No Known Allergies  Consultations: Cardiology   Procedures/Studies: DG Chest Port 1 View  Result Date: 12/12/2019 CLINICAL DATA:  Central chest pain radiating to the back. EXAM: PORTABLE CHEST 1 VIEW COMPARISON:  Chest radiograph 02/21/2017 FINDINGS: Monitoring leads overlie the patient. Stable cardiac and mediastinal contours. Mild bilateral interstitial opacities predominately within the mid and lower lungs bilaterally. Thoracic spine  degenerative changes. IMPRESSION: Bilateral mid lower lung interstitial opacities may represent edema or atypical infection. Electronically Signed   By: Lovey Newcomer M.D.   On: 12/12/2019 09:05      Discharge Exam: Vitals:   12/13/19 0014 12/13/19 0440  BP: 132/76 134/69  Pulse: 65 (!) 58  Resp: 11 15  Temp: 98 F (36.7 C) (!) 97.4 F (36.3 C)   SpO2: 98% 99%   Vitals:   12/12/19 2109 12/13/19 0014 12/13/19 0440 12/13/19 0444  BP: 137/74 132/76 134/69   Pulse: (!) 52 65 (!) 58   Resp: 12 11 15    Temp: 97.7 F (36.5 C) 98 F (36.7 C) (!) 97.4 F (36.3 C)   TempSrc:  Oral Oral   SpO2: 100% 98% 99%   Weight:    60.2 kg    General: Pt is alert, awake, not in acute distress Cardiovascular: RRR, S1/S2 +, no rubs, no gallops Respiratory: CTA bilaterally, no wheezing, no rhonchi Abdominal: Soft, NT, ND, bowel sounds + Extremities: no edema, no cyanosis    The results of significant diagnostics from this hospitalization (including imaging, microbiology, ancillary and laboratory) are listed below for reference.     Microbiology: Recent Results (from the past 240 hour(s))  Respiratory Panel by RT PCR (Flu A&B, Covid) - Nasopharyngeal Swab     Status: None   Collection Time: 12/12/19  9:11 AM   Specimen: Nasopharyngeal Swab  Result Value Ref Range Status   SARS Coronavirus 2 by RT PCR NEGATIVE NEGATIVE Final    Comment: (NOTE) SARS-CoV-2 target nucleic acids are NOT DETECTED. The SARS-CoV-2 RNA is generally detectable in upper respiratoy specimens during the acute phase of infection. The lowest concentration of SARS-CoV-2 viral copies this assay can detect is 131 copies/mL. A negative result does not preclude SARS-Cov-2 infection and should not be used as the sole basis for treatment or other patient management decisions. A negative result may occur with  improper specimen collection/handling, submission of specimen other than nasopharyngeal swab, presence of viral mutation(s) within the areas targeted by this assay, and inadequate number of viral copies (<131 copies/mL). A negative result must be combined with clinical observations, patient history, and epidemiological information. The expected result is Negative. Fact Sheet for Patients:  PinkCheek.be Fact Sheet for Healthcare Providers:   GravelBags.it This test is not yet ap proved or cleared by the [place] FDA and  has been authorized for detection and/or diagnosis of SARS-CoV-2 by FDA under an Emergency Use Authorization (EUA). This EUA will remain  in effect (meaning this test can be used) for the duration of the COVID-19 declaration under Section 564(b)(1) of the Act, 21 U.S.C. section 360bbb-3(b)(1), unless the authorization is terminated or revoked sooner.    Influenza A by PCR NEGATIVE NEGATIVE Final   Influenza B by PCR NEGATIVE NEGATIVE Final    Comment: (NOTE) The Xpert Xpress SARS-CoV-2/FLU/RSV assay is intended as an aid in  the diagnosis of influenza from Nasopharyngeal swab specimens and  should not be used as a sole basis for treatment. Nasal washings and  aspirates are unacceptable for Xpert Xpress SARS-CoV-2/FLU/RSV  testing. Fact Sheet for Patients: PinkCheek.be Fact Sheet for Healthcare Providers: GravelBags.it This test is not yet approved or cleared by the [place] FDA and  has been authorized for detection and/or diagnosis of SARS-CoV-2 by  FDA under an Emergency Use Authorization (EUA). This EUA will remain  in effect (meaning this test can be used) for the duration of the  Covid-19  declaration under Section 564(b)(1) of the Act, 21  U.S.C. section 360bbb-3(b)(1), unless the authorization is  terminated or revoked. Performed at [facility], [place] [address], [place], [place] [address]      Labs: BNP (last 3 results) No results for input(s): BNP in the last 8760 hours. Basic Metabolic Panel: Recent Labs  Lab 12/12/19 0840  NA 138  K 3.8  CL 104  CO2 24  GLUCOSE 97  BUN 17  CREATININE 1.07  CALCIUM 8.8*   Liver Function Tests: Recent Labs  Lab 12/12/19 0920  AST 20  ALT 13  ALKPHOS 78  BILITOT 0.5  PROT 7.3  ALBUMIN 3.8   No results for input(s): LIPASE, AMYLASE in the  last 168 hours. No results for input(s): AMMONIA in the last 168 hours. CBC: Recent Labs  Lab 12/12/19 0841  WBC 7.7  NEUTROABS 4.4  HGB 12.8*  HCT 39.5  MCV 92.1  PLT 151   Cardiac Enzymes: No results for input(s): CKTOTAL, CKMB, CKMBINDEX, TROPONINI in the last 168 hours. BNP: Invalid input(s): POCBNP CBG: No results for input(s): GLUCAP in the last 168 hours. D-Dimer Recent Labs    12/12/19 0920  DDIMER 0.34   Hgb A1c No results for input(s): HGBA1C in the last 72 hours. Lipid Profile Recent Labs    12/12/19 0920 12/12/19 1040  CHOL  --  171  HDL  --  45  LDLCALC  --  111*  TRIG 94 76  CHOLHDL  --  3.8   Thyroid function studies No results for input(s): TSH, T4TOTAL, T3FREE, THYROIDAB in the last 72 hours.  Invalid input(s): FREET3 Anemia work up [employer]    12/12/19 0920  FERRITIN 34   Urinalysis No results found for: COLORURINE, APPEARANCEUR, [place], [place], [place], [place], [place], [place], PROTEINUR, UROBILINOGEN, NITRITE, LEUKOCYTESUR Sepsis Labs Invalid input(s): PROCALCITONIN,  WBC,  LACTICIDVEN Microbiology Recent Results (from the past 240 hour(s))  Respiratory Panel by RT PCR (Flu A&B, Covid) - Nasopharyngeal Swab     Status: None   Collection Time: 12/12/19  9:11 AM   Specimen: Nasopharyngeal Swab  Result Value Ref Range Status   SARS Coronavirus 2 by RT PCR NEGATIVE NEGATIVE Final    Comment: (NOTE) SARS-CoV-2 target nucleic acids are NOT DETECTED. The SARS-CoV-2 RNA is generally detectable in upper respiratoy specimens during the acute phase of infection. The lowest concentration of SARS-CoV-2 viral copies this assay can detect is 131 copies/mL. A negative result does not preclude SARS-Cov-2 infection and should not be used as the sole basis for treatment or other patient management decisions. A negative result may occur with  improper specimen collection/handling, submission of specimen other than nasopharyngeal swab,  presence of viral mutation(s) within the areas targeted by this assay, and inadequate number of viral copies (<131 copies/mL). A negative result must be combined with clinical observations, patient history, and epidemiological information. The expected result is Negative. Fact Sheet for Patients:  PinkCheek.be Fact Sheet for Healthcare Providers:  GravelBags.it This test is not yet ap proved or cleared by the [place] FDA and  has been authorized for detection and/or diagnosis of SARS-CoV-2 by FDA under an Emergency Use Authorization (EUA). This EUA will remain  in effect (meaning this test can be used) for the duration of the COVID-19 declaration under Section 564(b)(1) of the Act, 21 U.S.C. section 360bbb-3(b)(1), unless the authorization is terminated or revoked sooner.    Influenza A by PCR NEGATIVE NEGATIVE Final   Influenza B by  PCR NEGATIVE NEGATIVE Final    Comment: (NOTE) The Xpert Xpress SARS-CoV-2/FLU/RSV assay is intended as an aid in  the diagnosis of influenza from Nasopharyngeal swab specimens and  should not be used as a sole basis for treatment. Nasal washings and  aspirates are unacceptable for Xpert Xpress SARS-CoV-2/FLU/RSV  testing. Fact Sheet for Patients: PinkCheek.be Fact Sheet for Healthcare Providers: GravelBags.it This test is not yet approved or cleared by the [place] FDA and  has been authorized for detection and/or diagnosis of SARS-CoV-2 by  FDA under an Emergency Use Authorization (EUA). This EUA will remain  in effect (meaning this test can be used) for the duration of the  Covid-19 declaration under Section 564(b)(1) of the Act, 21  U.S.C. section 360bbb-3(b)(1), unless the authorization is  terminated or revoked. Performed at [facility], [place] [address], [place], [place] [address]      Time coordinating  discharge: Over 30 minutes  SIGNED:   Darliss Cheney, MD  [facility] 12/13/2019, 9:30 AM  If 7PM-7AM, please contact night-coverage [email or web address] Password [email or web address]

## 2019-12-13 NOTE — Progress Notes (Addendum)
Progress Note  Patient Name: John Sosa Date of Encounter: 12/13/2019  Primary Cardiologist: Lauree Chandler, MD   Subjective   No CP  No SOB  Inpatient Medications    Scheduled Meds: . aspirin EC  81 mg Oral Daily  . heparin  5,000 Units Subcutaneous Q12H  . pantoprazole  40 mg Oral Daily  . triamcinolone  2 spray Nasal Daily   Continuous Infusions:  PRN Meds: acetaminophen, ondansetron (ZOFRAN) IV   Vital Signs    Vitals:   12/12/19 2109 12/13/19 0014 12/13/19 0440 12/13/19 0444  BP: 137/74 132/76 134/69   Pulse: (!) 52 65 (!) 58   Resp: 12 11 15    Temp: 97.7 F (36.5 C) 98 F (36.7 C) (!) 97.4 F (36.3 C)   TempSrc:  Oral Oral   SpO2: 100% 98% 99%   Weight:    60.2 kg    Intake/Output Summary (Last 24 hours) at 12/13/2019 0710 Last data filed at 12/13/2019 0014 Gross per 24 hour  Intake --  Output 825 ml  Net -825 ml   Last 3 Weights 12/13/2019 09/03/2019 09/03/2018  Weight (lbs) 132 lb 11.5 oz 141 lb 12.8 oz 147 lb 12.8 oz  Weight (kg) 60.2 kg 64.32 kg 67.042 kg      Telemetry    SB/SR - Personally Reviewed  ECG    No EKG - Personally Reviewed  Physical Exam   GEN: No acute distress.   Neck: No JVD Cardiac: RRR, no murmurs, rubs, or gallops.  Respiratory: Clear to auscultation bilaterally. GI: Soft, nontender, non-distended  MS: No edema; No deformity. Neuro:  Nonfocal  Psych: Normal affect   Labs    High Sensitivity Troponin:   Recent Labs  Lab 12/12/19 0840 12/12/19 1040  TROPONINIHS 4 4      Chemistry Recent Labs  Lab 12/12/19 0840 12/12/19 0920  NA 138  --   K 3.8  --   CL 104  --   CO2 24  --   GLUCOSE 97  --   BUN 17  --   CREATININE 1.07  --   CALCIUM 8.8*  --   PROT  --  7.3  ALBUMIN  --  3.8  AST  --  20  ALT  --  13  ALKPHOS  --  78  BILITOT  --  0.5  GFRNONAA >60  --   GFRAA >60  --   ANIONGAP 10  --      Hematology Recent Labs  Lab 12/12/19 0841  WBC 7.7  RBC 4.29  HGB 12.8*  HCT  39.5  MCV 92.1  MCH 29.8  MCHC 32.4  RDW 12.5  PLT 151    BNPNo results for input(s): BNP, PROBNP in the last 168 hours.   DDimer  Recent Labs  Lab 12/12/19 0920  DDIMER 0.34     Radiology    DG Chest Port 1 View  Result Date: 12/12/2019 CLINICAL DATA:  Central chest pain radiating to the back. EXAM: PORTABLE CHEST 1 VIEW COMPARISON:  Chest radiograph 02/21/2017 FINDINGS: Monitoring leads overlie the patient. Stable cardiac and mediastinal contours. Mild bilateral interstitial opacities predominately within the mid and lower lungs bilaterally. Thoracic spine degenerative changes. IMPRESSION: Bilateral mid lower lung interstitial opacities may represent edema or atypical infection. Electronically Signed   By: Lovey Newcomer M.D.   On: 12/12/2019 09:05    Cardiac Studies   none  Patient Profile     86 y.o. male with hx  of sinus bradycardia, GERD admitte for CP    Assessment & Plan    1  Chest pain  No complaints of pain     Trop negative   I would ambulate  If OK I would recomm d/c home   He can follow up in clinic for recurrence  ?GERD symptoms   He is on protonix here   Could use prilosec at home  2  Rhythm  SB/SR    3  HCM   LDL 111  HDL45  No plans for intervention    For questions or updates, please contact [facility] Please consult [email or web address] for contact info under        Signed, Dorris Carnes, MD  12/13/2019, 7:10 AM

## 2019-12-13 NOTE — Plan of Care (Signed)
°  Problem: Clinical Measurements: °Goal: Cardiovascular complication will be avoided °Outcome: Progressing °  °Problem: Activity: °Goal: Risk for activity intolerance will decrease °Outcome: Progressing °  °

## 2019-12-13 NOTE — Discharge Instructions (Signed)

## 2019-12-16 ENCOUNTER — Ambulatory Visit (INDEPENDENT_AMBULATORY_CARE_PROVIDER_SITE_OTHER): Payer: Medicare Other | Admitting: Physician Assistant

## 2019-12-16 ENCOUNTER — Encounter: Payer: Self-pay | Admitting: Physician Assistant

## 2019-12-16 ENCOUNTER — Other Ambulatory Visit: Payer: Self-pay

## 2019-12-16 VITALS — BP 120/60 | HR 60 | Ht 66.0 in | Wt 147.6 lb

## 2019-12-16 DIAGNOSIS — R072 Precordial pain: Secondary | ICD-10-CM

## 2019-12-16 DIAGNOSIS — E7800 Pure hypercholesterolemia, unspecified: Secondary | ICD-10-CM

## 2019-12-16 DIAGNOSIS — R001 Bradycardia, unspecified: Secondary | ICD-10-CM

## 2019-12-16 DIAGNOSIS — K219 Gastro-esophageal reflux disease without esophagitis: Secondary | ICD-10-CM

## 2019-12-16 DIAGNOSIS — R079 Chest pain, unspecified: Secondary | ICD-10-CM | POA: Diagnosis not present

## 2019-12-16 MED ORDER — NITROGLYCERIN 0.4 MG SL SUBL: 0.4000 mg | SUBLINGUAL_TABLET | SUBLINGUAL | 3 refills | Status: AC | PRN

## 2019-12-16 NOTE — Patient Instructions (Signed)
Medication Instructions:   TAKE NITROGLYCERIN 0.4 SUBLINGUAL  AS NEEDED FOR CHEST PAIN   *If you need a refill on your cardiac medications before your next appointment, please call your pharmacy*  Lab Work: NONE ORDERED  TODAY  If you have labs (blood work) drawn today and your tests are completely normal, you will receive your results only by: Marland Kitchen MyChart Message (if you have MyChart) OR . A paper copy in the mail If you have any lab test that is abnormal or we need to change your treatment, we will call you to review the results.  Testing/Procedures: NONE ORDERED  TODAY    Follow-Up: At [facility], you and your health needs are our priority.  As part of our continuing mission to provide you with exceptional heart care, we have created designated Provider Care Teams.  These Care Teams include your primary Cardiologist (physician) and Advanced Practice Providers (APPs -  Physician Assistants and Nurse Practitioners) who all work together to provide you with the care you need, when you need it.  Your next appointment:   6 month(s)  The format for your next appointment:   In Person  Provider:   You may see Lauree Chandler, MD  or one of the following Advanced Practice Providers on your designated Care Team:    Melina Copa, PA-C  Ermalinda Barrios, PA-C   Other Instructions

## 2019-12-16 NOTE — Progress Notes (Signed)
Cardiology Office Note:    Date:  12/16/2019   ID:  John Sosa, DOB September 12, 1929, MRN [id number]  PCP:  Wenda Low, MD  Cardiologist:  Lauree Chandler, MD   Electrophysiologist:  None   Referring MD: Wenda Low, MD   Chief Complaint:  Hospitalization Follow-up (Admx for chest pain)    Patient Profile:    John Sosa is a 86 y.o. male with:   Chest pain   ED visit 8/16 >> Tn neg; Myoview 9/16: no ischemia  Sinus bradycardia  GERD  Hyperlipidemia   Prior CV studies: Echocardiogram 08/21/16 EF 60-65, no RWMA, trivial AI  Myoview 07/20/15 EF 63, no scar or ischemia; Low Risk  History of Present Illness:    John Sosa was last seen by Dr. Angelena Form in 08/2019.  He was admitted 1/30-1/31 with chest pain. Hs-Troponin levels and DDimer were negative.  He was seen by Dr. Harrington Challenger for Cardiology.  His symptoms were not felt to be ischemic in nature.  He was DC on PPI to manage GERD.    He returns for follow-up.  He is here alone.  He never got the omeprazole filled.  He has not had any chest discomfort since discharge from the hospital.  The symptoms he went to the hospital with only lasted for a few seconds.  He has not had any exertional chest discomfort, shortness of breath, syncope, orthopnea or leg swelling.     Past Medical History:  Diagnosis Date  . GERD (gastroesophageal reflux disease)   . Hyperlipemia     Current Medications: Current Meds  Medication Sig  . omeprazole (PRILOSEC) 40 MG capsule Take 1 capsule (40 mg total) by mouth daily.  Marland Kitchen triamcinolone (NASACORT ALLERGY 24HR) 55 MCG/ACT AERO nasal inhaler Place 2 sprays into the nose as needed.     Allergies:   Patient has no known allergies.   Social History   Tobacco Use  . Smoking status: Former Smoker    Quit date: 11/20/1956    Years since quitting: 63.1  . Smokeless tobacco: Never Used  Substance Use Topics  . Alcohol use: No    Alcohol/week: 0.0 standard drinks  . Drug use: No      Family Hx: The patient's family history includes Diabetes in his brother.  ROS   EKGs/Labs/Other Test Reviewed:    EKG:  EKG is not  ordered today.  The ekg performed at the hospital 12/12/2019 demonstrates sinus bradycardia, heart rate 58, normal axis, no acute ST-T wave changes, QTC 426  Recent Labs: 12/12/2019: ALT 13; BUN 17; Creatinine, Ser 1.07; Hemoglobin 12.8; Platelets 151; Potassium 3.8; Sodium 138   Recent Lipid Panel Lab Results  Component Value Date/Time   CHOL 171 12/12/2019 10:40 AM   TRIG 76 12/12/2019 10:40 AM   HDL 45 12/12/2019 10:40 AM   CHOLHDL 3.8 12/12/2019 10:40 AM   LDLCALC 111 (H) 12/12/2019 10:40 AM    Physical Exam:    VS:  BP 120/60   Pulse 60   Ht 5\' 6"  (1.676 m)   Wt 147 lb 9.6 oz (67 kg)   SpO2 97%   BMI 23.82 kg/m     Wt Readings from Last 3 Encounters:  12/16/19 147 lb 9.6 oz (67 kg)  12/13/19 132 lb 11.5 oz (60.2 kg)  09/03/19 141 lb 12.8 oz (64.3 kg)     Constitutional:      Appearance: Healthy appearance. Not in distress.  Neck:     Thyroid: Thyroid normal.  Vascular: JVD normal.  Pulmonary:     Effort: Pulmonary effort is normal.     Breath sounds: No wheezing. No rales.  Cardiovascular:     Normal rate. Regular rhythm. Normal S1. Normal S2.     Murmurs: There is no murmur.  Edema:    Peripheral edema absent.  Abdominal:     Palpations: Abdomen is soft. There is no hepatomegaly.  Skin:    General: Skin is warm and dry.  Neurological:     General: No focal deficit present.     Mental Status: Alert and oriented to person, place and time.     Cranial Nerves: Cranial nerves are intact.      ASSESSMENT & PLAN:    1. Precordial chest pain His symptoms have resolved.  He ruled out for myocardial infarction in the hospital.  He has not had any recurrent symptoms.  He is concerned that his symptoms will return.  We discussed whether or not to have nitroglycerin available to take as needed.  He would like to have this  available.  I have instructed him on how to use it.  I have also advised him to contact us should he ever feel the need to take this medication.  If he has recurrent symptoms or takes nitroglycerin, I would pursue stress testing at that point.  At this time, he does not require further testing.  -Rx for nitroglycerin as needed for chest pain  2. Pure hypercholesterolemia This is managed by primary care.  3. Sinus bradycardia He is asymptomatic.  No further testing necessary.  4. Gastroesophageal reflux disease without esophagitis He has not started on omeprazole.  He is not having any symptoms at this time.  Therefore, he does not need to fill it now.     Dispo:  Return in about 6 months (around 06/14/2020) for Routine Follow Up w/ Dr. Angelena Form, in person.   Medication Adjustments/Labs and Tests Ordered: Current medicines are reviewed at length with the patient today.  Concerns regarding medicines are outlined above.  Tests Ordered: No orders of the defined types were placed in this encounter.  Medication Changes: Meds ordered this encounter  Medications  . nitroGLYCERIN (NITROSTAT) 0.4 MG SL tablet    Sig: Place 1 tablet (0.4 mg total) under the tongue every 5 (five) minutes as needed for chest pain.    Dispense:  25 tablet    Refill:  3    Order Specific Question:   Supervising Provider    Answer:   Lelon Perla [[id number]]    Signed, Richardson Dopp, PA-C  12/16/2019 3:00 PM    [facility] [place], [place], [place]  [address] Phone: [phone]; Fax: [phone]

## 2019-12-17 LAB — CULTURE, BLOOD (ROUTINE X 2)
Culture: NO GROWTH
Culture: NO GROWTH
Special Requests: ADEQUATE

## 2020-01-04 DIAGNOSIS — Z23 Encounter for immunization: Secondary | ICD-10-CM | POA: Diagnosis not present

## 2020-04-06 DIAGNOSIS — H524 Presbyopia: Secondary | ICD-10-CM | POA: Diagnosis not present

## 2020-04-06 DIAGNOSIS — H26491 Other secondary cataract, right eye: Secondary | ICD-10-CM | POA: Diagnosis not present

## 2020-04-06 DIAGNOSIS — H353133 Nonexudative age-related macular degeneration, bilateral, advanced atrophic without subfoveal involvement: Secondary | ICD-10-CM | POA: Diagnosis not present

## 2020-04-06 DIAGNOSIS — H52203 Unspecified astigmatism, bilateral: Secondary | ICD-10-CM | POA: Diagnosis not present

## 2020-06-15 DIAGNOSIS — R972 Elevated prostate specific antigen [PSA]: Secondary | ICD-10-CM | POA: Diagnosis not present

## 2020-06-15 DIAGNOSIS — N400 Enlarged prostate without lower urinary tract symptoms: Secondary | ICD-10-CM | POA: Diagnosis not present

## 2020-07-21 DIAGNOSIS — M1990 Unspecified osteoarthritis, unspecified site: Secondary | ICD-10-CM | POA: Diagnosis not present

## 2020-07-21 DIAGNOSIS — R001 Bradycardia, unspecified: Secondary | ICD-10-CM | POA: Diagnosis not present

## 2020-07-21 DIAGNOSIS — K219 Gastro-esophageal reflux disease without esophagitis: Secondary | ICD-10-CM | POA: Diagnosis not present

## 2020-07-21 DIAGNOSIS — E538 Deficiency of other specified B group vitamins: Secondary | ICD-10-CM | POA: Diagnosis not present

## 2020-07-21 DIAGNOSIS — Z23 Encounter for immunization: Secondary | ICD-10-CM | POA: Diagnosis not present

## 2020-07-21 DIAGNOSIS — J310 Chronic rhinitis: Secondary | ICD-10-CM | POA: Diagnosis not present

## 2020-07-21 DIAGNOSIS — E7800 Pure hypercholesterolemia, unspecified: Secondary | ICD-10-CM | POA: Diagnosis not present

## 2020-07-21 DIAGNOSIS — G43009 Migraine without aura, not intractable, without status migrainosus: Secondary | ICD-10-CM | POA: Diagnosis not present

## 2020-07-21 DIAGNOSIS — D696 Thrombocytopenia, unspecified: Secondary | ICD-10-CM | POA: Diagnosis not present

## 2020-07-21 DIAGNOSIS — Z1389 Encounter for screening for other disorder: Secondary | ICD-10-CM | POA: Diagnosis not present

## 2020-07-21 DIAGNOSIS — R413 Other amnesia: Secondary | ICD-10-CM | POA: Diagnosis not present

## 2020-07-21 DIAGNOSIS — Z0000 Encounter for general adult medical examination without abnormal findings: Secondary | ICD-10-CM | POA: Diagnosis not present

## 2020-07-21 DIAGNOSIS — N400 Enlarged prostate without lower urinary tract symptoms: Secondary | ICD-10-CM | POA: Diagnosis not present

## 2020-07-25 DIAGNOSIS — L03031 Cellulitis of right toe: Secondary | ICD-10-CM | POA: Diagnosis not present

## 2020-09-17 DIAGNOSIS — Z23 Encounter for immunization: Secondary | ICD-10-CM | POA: Diagnosis not present

## 2021-01-11 ENCOUNTER — Telehealth: Payer: Self-pay | Admitting: Cardiovascular Disease

## 2021-01-11 NOTE — Telephone Encounter (Signed)
John Sosa, John Sosa and Medical POA for the patient called. She wanted to know if there was any way that she could get permission for a 3rd person to come in to the patient's appointment on Monday. The Patient's wife will not relinquish her opportunity to be in the room with the patient during his appointment. Per the John Sosa, the wife is sharp but if the meeting does not go well she may be thrown for a loop and not remember some details.   Advised the John Sosa that if permission was not granted that she would have to be called in and be included on speaker phone. John Sosa understood but would still like to try and get permission

## 2021-01-11 NOTE — Telephone Encounter (Signed)
Called patient's home number.  Left message on his VM that he can bring his wife and his daughter with him to appointment on 3/7 if he would like to.

## 2021-01-16 ENCOUNTER — Ambulatory Visit (INDEPENDENT_AMBULATORY_CARE_PROVIDER_SITE_OTHER): Payer: Medicare Other | Admitting: Cardiovascular Disease

## 2021-01-16 ENCOUNTER — Encounter: Payer: Self-pay | Admitting: Cardiovascular Disease

## 2021-01-16 ENCOUNTER — Other Ambulatory Visit: Payer: Self-pay

## 2021-01-16 VITALS — BP 104/70 | HR 58 | Ht 66.0 in | Wt 147.0 lb

## 2021-01-16 DIAGNOSIS — R001 Bradycardia, unspecified: Secondary | ICD-10-CM | POA: Diagnosis not present

## 2021-01-16 DIAGNOSIS — R072 Precordial pain: Secondary | ICD-10-CM | POA: Diagnosis not present

## 2021-01-16 DIAGNOSIS — E7800 Pure hypercholesterolemia, unspecified: Secondary | ICD-10-CM

## 2021-01-16 NOTE — Progress Notes (Signed)
Chief Complaint  Patient presents with  . Follow-up    Sinus bradycardia    History of Present Illness: 87 yo male with history of GERD and HLD who is here today for cardiac follow up. He was evaluated in the [facility] ED 07/12/15 after an episode of chest pain. Troponin was negative, EKG without ischemic changes. Stress myoview 07/20/15 with no ischemia. He has no known CAD. He was admitted to [facility] January 2021 with chest pain. Troponin and d-dimer negative. His pain was felt to be GI related.   He is here today for follow up. The patient denies any chest pain, dyspnea, palpitations, lower extremity edema, orthopnea, PND, dizziness, near syncope or syncope.    Primary Care Physician: Wenda Low, MD  Past Medical History:  Diagnosis Date  . GERD (gastroesophageal reflux disease)   . Hyperlipemia     Past Surgical History:  Procedure Laterality Date  . HEMORRHOID SURGERY  1950    Current Outpatient Medications  Medication Sig Dispense Refill  . Cyanocobalamin (VITAMIN B 12 PO) Take 100 mcg by mouth daily.    . finasteride (PROSCAR) 5 MG tablet Take 5 mg by mouth daily.    . nitroGLYCERIN (NITROSTAT) 0.4 MG SL tablet Place 1 tablet (0.4 mg total) under the tongue every 5 (five) minutes as needed for chest pain. 25 tablet 3  . omeprazole (PRILOSEC) 40 MG capsule Take 1 capsule (40 mg total) by mouth daily. 30 capsule 0  . simvastatin (ZOCOR) 10 MG tablet Take 1 tablet by mouth daily.    Marland Kitchen triamcinolone (NASACORT) 55 MCG/ACT AERO nasal inhaler Place 2 sprays into the nose as needed.     No current facility-administered medications for this visit.    No Known Allergies  Social History   Socioeconomic History  . Marital status: Married    Spouse name: Not on file  . Number of children: 3  . Years of education: Not on file  . Highest education level: Not on file  Occupational History  . Occupation: Retired-Textile  Tobacco Use  . Smoking status: Former Smoker    Quit date:  11/20/1956    Years since quitting: 64.2  . Smokeless tobacco: Never Used  Substance and Sexual Activity  . Alcohol use: No    Alcohol/week: 0.0 standard drinks  . Drug use: No  . Sexual activity: Not on file  Other Topics Concern  . Not on file  Social History Narrative  . Not on file   Social Determinants of Health   Financial Resource Strain: Not on file  Food Insecurity: Not on file  Transportation Needs: Not on file  Physical Activity: Not on file  Stress: Not on file  Social Connections: Not on file  Intimate Partner Violence: Not on file    Family History  Problem Relation Age of Onset  . Diabetes Brother     Review of Systems:  As stated in the HPI and otherwise negative.   BP 104/70   Pulse (!) 58   Ht 5\' 6"  (1.676 m)   Wt 147 lb (66.7 kg)   SpO2 98%   BMI 23.73 kg/m   Physical Examination:  General: Well developed, well nourished, NAD  HEENT: OP clear, mucus membranes moist  SKIN: warm, dry. No rashes. Neuro: No focal deficits  Musculoskeletal: Muscle strength 5/5 all ext  Psychiatric: Mood and affect normal  Neck: No JVD, no carotid bruits, no thyromegaly, no lymphadenopathy.  Lungs:Clear bilaterally, no wheezes, rhonci, crackles Cardiovascular: Regular  rate and rhythm. No murmurs, gallops or rubs. Abdomen:Soft. Bowel sounds present. Non-tender.  Extremities: No lower extremity edema. Pulses are 2 + in the bilateral DP/PT.  Echo 08/21/16: - Left ventricle: The cavity size was normal. Wall thickness was   normal. Systolic function was normal. The estimated ejection   fraction was in the range of 60% to 65%. Wall motion was normal;   there were no regional wall motion abnormalities. - Aortic valve: Mildly calcified annulus. There was trivial   regurgitation.  EKG:  EKG is ordered today. The ekg ordered today demonstrates Sinus bradycardia, rate 58 bpm. PVCs  Recent Labs: No results found for requested labs within last 8760 hours.   Lipid  Panel    Component Value Date/Time   CHOL 171 12/12/2019 1040   TRIG 76 12/12/2019 1040   HDL 45 12/12/2019 1040   CHOLHDL 3.8 12/12/2019 1040   VLDL 15 12/12/2019 1040   LDLCALC 111 (H) 12/12/2019 1040     Wt Readings from Last 3 Encounters:  01/16/21 147 lb (66.7 kg)  12/16/19 147 lb 9.6 oz (67 kg)  12/13/19 132 lb 11.5 oz (60.2 kg)     Other studies Reviewed: Additional studies/ records that were reviewed today include: . Review of the above records demonstrates:    Assessment and Plan:   1. Chest pain: His chest pain over the years is felt to be GI related. No recent chest pain.   Continue PPI  2. Hyperlipidemia: Lipids followed in primary care. Continue statin. He may try holding the statin for several weeks to see if his night sweats are related to the statin.   3. Sinus bradycardia: Normal LV function by echo October 2017. He is on no rate controlling agents. He has no dizziness.   4. PVCs: No palpitations.   Current medicines are reviewed at length with the patient today.  The patient does not have concerns regarding medicines.  The following changes have been made:  no change  Labs/ tests ordered today include:   Orders Placed This Encounter  Procedures  . EKG 12-Lead    Disposition:   F/U with me in 12  months  Signed, Lauree Chandler, MD 01/16/2021 4:27 PM    [facility] [place], [place], [place]  [address] Phone: [phone]; Fax: [phone]

## 2021-01-16 NOTE — Patient Instructions (Signed)
Medication Instructions:  No changes *If you need a refill on your cardiac medications before your next appointment, please call your pharmacy*   Lab Work: none If you have labs (blood work) drawn today and your tests are completely normal, you will receive your results only by: Marland Kitchen MyChart Message (if you have MyChart) OR . A paper copy in the mail If you have any lab test that is abnormal or we need to change your treatment, we will call you to review the results.   Testing/Procedures: none   Follow-Up: At [facility], you and your health needs are our priority.  As part of our continuing mission to provide you with exceptional heart care, we have created designated Provider Care Teams.  These Care Teams include your primary Cardiologist (physician) and Advanced Practice Providers (APPs -  Physician Assistants and Nurse Practitioners) who all work together to provide you with the care you need, when you need it.  We recommend signing up for the patient portal called "MyChart".  Sign up information is provided on this After Visit Summary.  MyChart is used to connect with patients for Virtual Visits (Telemedicine).  Patients are able to view lab/test results, encounter notes, upcoming appointments, etc.  Non-urgent messages can be sent to your provider as well.   To learn more about what you can do with MyChart, go to [email or web address].    Your next appointment:   8 month(s)  The format for your next appointment:   In Person  Provider:   You may see Lauree Chandler, MD or one of the following Advanced Practice Providers on your designated Care Team:    Melina Copa, PA-C  Ermalinda Barrios, PA-C   Other Instructions

## 2021-02-25 ENCOUNTER — Encounter (HOSPITAL_BASED_OUTPATIENT_CLINIC_OR_DEPARTMENT_OTHER): Payer: Self-pay | Admitting: Emergency Medicine

## 2021-02-25 ENCOUNTER — Other Ambulatory Visit: Payer: Self-pay

## 2021-02-25 ENCOUNTER — Emergency Department (HOSPITAL_BASED_OUTPATIENT_CLINIC_OR_DEPARTMENT_OTHER)
Admission: EM | Admit: 2021-02-25 | Discharge: 2021-02-25 | Disposition: A | Discharge status: Home / Self Care | Payer: Medicare Other | Attending: Emergency Medicine | Admitting: Emergency Medicine

## 2021-02-25 DIAGNOSIS — Z87891 Personal history of nicotine dependence: Secondary | ICD-10-CM | POA: Diagnosis not present

## 2021-02-25 DIAGNOSIS — M542 Cervicalgia: Secondary | ICD-10-CM | POA: Insufficient documentation

## 2021-02-25 DIAGNOSIS — R519 Headache, unspecified: Secondary | ICD-10-CM | POA: Insufficient documentation

## 2021-02-25 DIAGNOSIS — S161XXA Strain of muscle, fascia and tendon at neck level, initial encounter: Secondary | ICD-10-CM

## 2021-02-25 MED ORDER — ACETAMINOPHEN 325 MG PO TABS
650.0000 mg | ORAL_TABLET | Freq: Once | ORAL | Status: AC
  Administered 2021-02-25: 650 mg via ORAL
  Filled 2021-02-25: qty 2

## 2021-02-25 NOTE — ED Triage Notes (Signed)
  Patient comes in with R sided neck pain that started around 0130 this morning.  Daughter states that patient went to a show and complained of neck soreness when he got home.  Patient felt like the R side of his face was swollen and his wife called the daughter to bring him in the be checked out.  Daughter states patient has been having issues with his memory but does not have a dementia diagnosis.  No neurological symptoms.  No facial droop, or headache.  NIH 0.  Pain 2/10

## 2021-02-25 NOTE — ED Provider Notes (Signed)
[facility] Provider Note  CSN: [id number] Arrival date & time: 02/25/21 0230    History Chief Complaint  Patient presents with  . Facial Pain  . Neck Injury    HPI  John Sosa is a 87 y.o. male without significant PMH brought to the ED by his wife and daughter for evaluation of R neck pain. Patient reports some R sided neck pain yesterday evening before bed. Mild-moderate aching. He attributed the pain to having mowed his lawn and then watching a play from the front row earlier in the day. He did not take any medications for the pain but woke up around 0130 with worsened pain and so his wife called the daughter to bring him here for evaluation. He points generally to the right side of his face/neck as the location of his pain but no focal spot he can identify. He reports he feels like his R face is swollen although wife reports it looks normal to her. At the time of my evaluation he reports his pain has resolved. He has not had any fever, nasal congestion, sore throat, ear pain, CP, SOB, N/V/D or abdominal pain. No numbness or weakness in arms or legs. Wife denies any facial droop or slurred speech. Patient has had some memory problems lately, but no formal diagnosis of dementia.    Past Medical History:  Diagnosis Date  . GERD (gastroesophageal reflux disease)   . Hyperlipemia     Past Surgical History:  Procedure Laterality Date  . HEMORRHOID SURGERY  1950    Family History  Problem Relation Age of Onset  . Diabetes Brother     Social History   Tobacco Use  . Smoking status: Former Smoker    Quit date: 11/20/1956    Years since quitting: 64.3  . Smokeless tobacco: Never Used  Substance Use Topics  . Alcohol use: No    Alcohol/week: 0.0 standard drinks  . Drug use: No     Home Medications Prior to Admission medications   Medication Sig Start Date End Date Taking? Authorizing Provider  Cyanocobalamin (VITAMIN B 12 PO) Take 100  mcg by mouth daily.    [provider]  finasteride (PROSCAR) 5 MG tablet Take 5 mg by mouth daily. 12/02/20   [provider]  nitroGLYCERIN (NITROSTAT) 0.4 MG SL tablet Place 1 tablet (0.4 mg total) under the tongue every 5 (five) minutes as needed for chest pain. 12/16/19 12/15/20  Richardson Dopp T, PA-C  omeprazole (PRILOSEC) 40 MG capsule Take 1 capsule (40 mg total) by mouth daily. 12/13/19 01/12/20  Darliss Cheney, MD  simvastatin (ZOCOR) 10 MG tablet Take 1 tablet by mouth daily. 10/17/20   [provider]  triamcinolone (NASACORT) 55 MCG/ACT AERO nasal inhaler Place 2 sprays into the nose as needed.    [provider]     Allergies    Patient has no known allergies.   Review of Systems   Review of Systems A comprehensive review of systems was completed and negative except as noted in HPI.    Physical Exam BP (!) 156/90 (BP Location: Left Arm)   Pulse (!) 54   Temp 97.6 F (36.4 C) (Oral)   Resp 16   Ht 5\' 6"  (1.676 m)   Wt 66.7 kg   SpO2 100%   BMI 23.73 kg/m   Physical Exam Vitals and nursing note reviewed.  Constitutional:      Appearance: Normal appearance.  HENT:  Head: Normocephalic and atraumatic.     Comments: No facial swelling    Right Ear: Tympanic membrane and ear canal normal.     Left Ear: Tympanic membrane and ear canal normal.     Nose: Nose normal.     Mouth/Throat:     Mouth: Mucous membranes are moist.  Eyes:     Extraocular Movements: Extraocular movements intact.     Conjunctiva/sclera: Conjunctivae normal.  Neck:     Vascular: No carotid bruit.  Cardiovascular:     Rate and Rhythm: Normal rate.  Pulmonary:     Effort: Pulmonary effort is normal.     Breath sounds: Normal breath sounds.  Abdominal:     General: Abdomen is flat.     Palpations: Abdomen is soft.     Tenderness: There is no abdominal tenderness.  Musculoskeletal:        General: No swelling. Normal range of motion.     Cervical back: Neck  supple. No tenderness.  Skin:    General: Skin is warm and dry.  Neurological:     General: No focal deficit present.     Mental Status: He is alert.     Cranial Nerves: No cranial nerve deficit.     Sensory: No sensory deficit.     Motor: No weakness.     Gait: Gait normal.  Psychiatric:        Mood and Affect: Mood normal.      ED Results / Procedures / Treatments   Labs (all labs ordered are listed, but only abnormal results are displayed) Labs Reviewed - No data to display  EKG None   Radiology No results found.  Procedures Procedures  Medications Ordered in the ED Medications  acetaminophen (TYLENOL) tablet 650 mg (has no administration in time range)     MDM Rules/Calculators/A&P MDM Patient with R neck and face discomfort has a normal exam now. Offered ED workup including labs and imaging but patient and family decline at this time. I addressed their concerns for carotid or cardiac etiology but no signs of either on exam with normal neuro exam, no focal tenderness and no symptoms consistent with ACS. Family would like to take him home at this time. Advised to take APAP/Motrin for any discomfort, rest and follow up with PCP. RTED for any other concerns.  ED Course  I have reviewed the triage vital signs and the nursing notes.  Pertinent labs & imaging results that were available during my care of the patient were reviewed by me and considered in my medical decision making (see chart for details).     Final Clinical Impression(s) / ED Diagnoses Final diagnoses:  None    Rx / DC Orders ED Discharge Orders    None       Truddie Hidden, MD 02/25/21 [phone]

## 2021-12-12 ENCOUNTER — Ambulatory Visit: Payer: Medicare Other | Admitting: Cardiovascular Disease

## 2021-12-12 ENCOUNTER — Encounter (INDEPENDENT_AMBULATORY_CARE_PROVIDER_SITE_OTHER): Payer: Self-pay

## 2021-12-12 ENCOUNTER — Encounter: Payer: Self-pay | Admitting: Cardiovascular Disease

## 2021-12-12 ENCOUNTER — Other Ambulatory Visit: Payer: Self-pay

## 2021-12-12 VITALS — BP 116/60 | HR 52 | Ht 66.0 in | Wt 138.0 lb

## 2021-12-12 DIAGNOSIS — E7800 Pure hypercholesterolemia, unspecified: Secondary | ICD-10-CM

## 2021-12-12 DIAGNOSIS — K219 Gastro-esophageal reflux disease without esophagitis: Secondary | ICD-10-CM | POA: Diagnosis not present

## 2021-12-12 DIAGNOSIS — R001 Bradycardia, unspecified: Secondary | ICD-10-CM | POA: Diagnosis not present

## 2021-12-12 NOTE — Progress Notes (Signed)
Chief Complaint  Patient presents with   Follow-up    Hyperlipidemia    History of Present Illness: 88 yo male with history of GERD and HLD who is here today for cardiac follow up. He was evaluated in the [facility] ED 07/12/15 after an episode of chest pain. Troponin was negative, EKG without ischemic changes. Stress myoview 07/20/15 with no ischemia. He has no known CAD. He was admitted to [facility] January 2021 with chest pain. Troponin and d-dimer negative. His pain was felt to be GI related.   He is here today for follow up. The patient denies any chest pain, dyspnea, palpitations, lower extremity edema, orthopnea, PND, dizziness, near syncope or syncope.    Primary Care Physician: Wenda Low, MD  Past Medical History:  Diagnosis Date   GERD (gastroesophageal reflux disease)    Hyperlipemia     Past Surgical History:  Procedure Laterality Date   HEMORRHOID SURGERY  1950    Current Outpatient Medications  Medication Sig Dispense Refill   Cyanocobalamin (VITAMIN B 12 PO) Take 100 mcg by mouth daily.     finasteride (PROSCAR) 5 MG tablet Take 5 mg by mouth daily.     nitroGLYCERIN (NITROSTAT) 0.4 MG SL tablet Place 1 tablet (0.4 mg total) under the tongue every 5 (five) minutes as needed for chest pain. 25 tablet 3   omeprazole (PRILOSEC) 40 MG capsule Take 1 capsule (40 mg total) by mouth daily. 30 capsule 0   simvastatin (ZOCOR) 10 MG tablet Take 1 tablet by mouth daily.     QUEtiapine (SEROQUEL) 25 MG tablet Take 12.5-25 mg by mouth at bedtime.     No current facility-administered medications for this visit.    No Known Allergies  Social History   Socioeconomic History   Marital status: Married    Spouse name: Not on file   Number of children: 3   Years of education: Not on file   Highest education level: Not on file  Occupational History   Occupation: Retired-Textile  Tobacco Use   Smoking status: Former    Types: Cigarettes    Quit date: 11/20/1956    Years since  quitting: 18.1   Smokeless tobacco: Never  Substance and Sexual Activity   Alcohol use: No    Alcohol/week: 0.0 standard drinks   Drug use: No   Sexual activity: Not on file  Other Topics Concern   Not on file  Social History Narrative   Not on file   Social Determinants of Health   Financial Resource Strain: Not on file  Food Insecurity: Not on file  Transportation Needs: Not on file  Physical Activity: Not on file  Stress: Not on file  Social Connections: Not on file  Intimate Partner Violence: Not on file    Family History  Problem Relation Age of Onset   Diabetes Brother     Review of Systems:  As stated in the HPI and otherwise negative.   BP 116/60    Pulse (!) 52    Ht 5\' 6"  (1.676 m)    Wt 138 lb (62.6 kg)    SpO2 97%    BMI 22.27 kg/m   Physical Examination: General: Well developed, well nourished, NAD  HEENT: OP clear, mucus membranes moist  SKIN: warm, dry. No rashes. Neuro: No focal deficits  Musculoskeletal: Muscle strength 5/5 all ext  Psychiatric: Mood and affect normal  Neck: No JVD, no carotid bruits, no thyromegaly, no lymphadenopathy.  Lungs:Clear bilaterally, no wheezes, rhonci,  crackles Cardiovascular: Regular rate and rhythm. No murmurs, gallops or rubs. Abdomen:Soft. Bowel sounds present. Non-tender.  Extremities: No lower extremity edema. Pulses are 2 + in the bilateral DP/PT.  Echo 08/21/16: - Left ventricle: The cavity size was normal. Wall thickness was   normal. Systolic function was normal. The estimated ejection   fraction was in the range of 60% to 65%. Wall motion was normal;   there were no regional wall motion abnormalities. - Aortic valve: Mildly calcified annulus. There was trivial   regurgitation.  EKG:  EKG is ordered today. The ekg ordered today demonstrates Sinus bradycardia rate 49 bpm  Recent Labs: No results found for requested labs within last 8760 hours.   Lipid Panel    Component Value Date/Time   CHOL 171  12/12/2019 1040   TRIG 76 12/12/2019 1040   HDL 45 12/12/2019 1040   CHOLHDL 3.8 12/12/2019 1040   VLDL 15 12/12/2019 1040   LDLCALC 111 (H) 12/12/2019 1040     Wt Readings from Last 3 Encounters:  12/12/21 138 lb (62.6 kg)  02/25/21 147 lb (66.7 kg)  01/16/21 147 lb (66.7 kg)     Other studies Reviewed: Additional studies/ records that were reviewed today include: . Review of the above records demonstrates:    Assessment and Plan:   1. Chest pain: His chest pain over the years is felt to be GI related. He has not had recent chest pain. Continue PPI  2. Hyperlipidemia: Lipids followed in primary care. LDL 80 in Sept 2022. Continue statin.   3. Sinus bradycardia: Normal LV function by echo October 2017. He is on no rate controlling agents. No dizziness  4. PVCs: He has no palpitations  Current medicines are reviewed at length with the patient today.  The patient does not have concerns regarding medicines.  The following changes have been made:  no change  Labs/ tests ordered today include:   Orders Placed This Encounter  Procedures   EKG 12-Lead    Disposition:   F/U with me in 12  months  Signed, Lauree Chandler, MD 12/12/2021 2:02 PM    [facility] [address], [place], [place]  [address] Phone: [phone]; Fax: [phone]

## 2021-12-12 NOTE — Patient Instructions (Addendum)
Medication Instructions:  Your physician recommends that you continue on your current medications as directed. Please refer to the Current Medication list given to you today.   *If you need a refill on your cardiac medications before your next appointment, please call your pharmacy*   Lab Work: None ordered   If you have labs (blood work) drawn today and your tests are completely normal, you will receive your results only by: [place] (if you have MyChart) OR A paper copy in the mail If you have any lab test that is abnormal or we need to change your treatment, we will call you to review the results.   Testing/Procedures: None ordered    Follow-Up: At [facility], you and your health needs are our priority.  As part of our continuing mission to provide you with exceptional heart care, we have created designated Provider Care Teams.  These Care Teams include your primary Cardiologist (physician) and Advanced Practice Providers (APPs -  Physician Assistants and Nurse Practitioners) who all work together to provide you with the care you need, when you need it.  We recommend signing up for the patient portal called "MyChart".  Sign up information is provided on this After Visit Summary.  MyChart is used to connect with patients for Virtual Visits (Telemedicine).  Patients are able to view lab/test results, encounter notes, upcoming appointments, etc.  Non-urgent messages can be sent to your provider as well.   To learn more about what you can do with MyChart, go to [email or web address].    Your next appointment:   12 month(s)  The format for your next appointment:   In Person  Provider:   Lauree Chandler, MD     Other Instructions None

## 2023-01-18 ENCOUNTER — Inpatient Hospital Stay (HOSPITAL_COMMUNITY)
Admission: EM | Admit: 2023-01-18 | Discharge: 2023-01-21 | DRG: 193 | Disposition: A | Discharge status: Skilled Nursing Facility | Payer: Medicare Other | Source: Skilled Nursing Facility | Attending: Internal Medicine | Admitting: Internal Medicine

## 2023-01-18 ENCOUNTER — Encounter (HOSPITAL_COMMUNITY): Payer: Self-pay | Admitting: Internal Medicine

## 2023-01-18 ENCOUNTER — Emergency Department (HOSPITAL_COMMUNITY): Payer: Medicare Other

## 2023-01-18 DIAGNOSIS — Z66 Do not resuscitate: Secondary | ICD-10-CM | POA: Diagnosis present

## 2023-01-18 DIAGNOSIS — F0390 Unspecified dementia without behavioral disturbance: Secondary | ICD-10-CM | POA: Diagnosis present

## 2023-01-18 DIAGNOSIS — E785 Hyperlipidemia, unspecified: Secondary | ICD-10-CM | POA: Diagnosis present

## 2023-01-18 DIAGNOSIS — Z79899 Other long term (current) drug therapy: Secondary | ICD-10-CM | POA: Diagnosis not present

## 2023-01-18 DIAGNOSIS — Z87891 Personal history of nicotine dependence: Secondary | ICD-10-CM | POA: Diagnosis not present

## 2023-01-18 DIAGNOSIS — H9190 Unspecified hearing loss, unspecified ear: Secondary | ICD-10-CM | POA: Insufficient documentation

## 2023-01-18 DIAGNOSIS — N400 Enlarged prostate without lower urinary tract symptoms: Secondary | ICD-10-CM | POA: Diagnosis present

## 2023-01-18 DIAGNOSIS — I700 Atherosclerosis of aorta: Secondary | ICD-10-CM | POA: Diagnosis present

## 2023-01-18 DIAGNOSIS — G9341 Metabolic encephalopathy: Secondary | ICD-10-CM | POA: Diagnosis present

## 2023-01-18 DIAGNOSIS — F4310 Post-traumatic stress disorder, unspecified: Secondary | ICD-10-CM | POA: Diagnosis present

## 2023-01-18 DIAGNOSIS — K219 Gastro-esophageal reflux disease without esophagitis: Secondary | ICD-10-CM | POA: Diagnosis present

## 2023-01-18 DIAGNOSIS — J189 Pneumonia, unspecified organism: Principal | ICD-10-CM

## 2023-01-18 DIAGNOSIS — D649 Anemia, unspecified: Secondary | ICD-10-CM | POA: Diagnosis present

## 2023-01-18 DIAGNOSIS — R918 Other nonspecific abnormal finding of lung field: Secondary | ICD-10-CM | POA: Diagnosis present

## 2023-01-18 LAB — URINALYSIS, ROUTINE W REFLEX MICROSCOPIC
Bilirubin Urine: NEGATIVE
Glucose, UA: NEGATIVE mg/dL
Hgb urine dipstick: NEGATIVE
Ketones, ur: NEGATIVE mg/dL
Leukocytes,Ua: NEGATIVE
Nitrite: NEGATIVE
Protein, ur: NEGATIVE mg/dL
Specific Gravity, Urine: 1.017 (ref 1.005–1.030)
pH: 6 (ref 5.0–8.0)

## 2023-01-18 LAB — CBC WITH DIFFERENTIAL/PLATELET
Abs Immature Granulocytes: 0.06 10*3/uL (ref 0.00–0.07)
Basophils Absolute: 0.1 10*3/uL (ref 0.0–0.1)
Basophils Relative: 1 %
Eosinophils Absolute: 0.2 10*3/uL (ref 0.0–0.5)
Eosinophils Relative: 4 %
HCT: 38.1 % — ABNORMAL LOW (ref 39.0–52.0)
Hemoglobin: 12.1 g/dL — ABNORMAL LOW (ref 13.0–17.0)
Immature Granulocytes: 1 %
Lymphocytes Relative: 17 %
Lymphs Abs: 1.1 10*3/uL (ref 0.7–4.0)
MCH: 29.9 pg (ref 26.0–34.0)
MCHC: 31.8 g/dL (ref 30.0–36.0)
MCV: 94.1 fL (ref 80.0–100.0)
Monocytes Absolute: 0.7 10*3/uL (ref 0.1–1.0)
Monocytes Relative: 10 %
Neutro Abs: 4.2 10*3/uL (ref 1.7–7.7)
Neutrophils Relative %: 67 %
Platelets: 193 10*3/uL (ref 150–400)
RBC: 4.05 MIL/uL — ABNORMAL LOW (ref 4.22–5.81)
RDW: 12.7 % (ref 11.5–15.5)
WBC: 6.3 10*3/uL (ref 4.0–10.5)
nRBC: 0 % (ref 0.0–0.2)

## 2023-01-18 LAB — COMPREHENSIVE METABOLIC PANEL
ALT: 13 U/L (ref 0–44)
AST: 18 U/L (ref 15–41)
Albumin: 3.8 g/dL (ref 3.5–5.0)
Alkaline Phosphatase: 78 U/L (ref 38–126)
Anion gap: 8 (ref 5–15)
BUN: 27 mg/dL — ABNORMAL HIGH (ref 8–23)
CO2: 25 mmol/L (ref 22–32)
Calcium: 8.7 mg/dL — ABNORMAL LOW (ref 8.9–10.3)
Chloride: 104 mmol/L (ref 98–111)
Creatinine, Ser: 0.93 mg/dL (ref 0.61–1.24)
GFR, Estimated: >60 mL/min (ref >60)
Glucose, Bld: 86 mg/dL (ref 70–99)
Potassium: 4 mmol/L (ref 3.5–5.1)
Sodium: 137 mmol/L (ref 135–145)
Total Bilirubin: 0.7 mg/dL (ref 0.3–1.2)
Total Protein: 7.4 g/dL (ref 6.5–8.1)

## 2023-01-18 LAB — PROCALCITONIN: Procalcitonin: <0.1 ng/mL

## 2023-01-18 MED ORDER — ACETAMINOPHEN 650 MG RE SUPP: 650.0000 mg | Freq: Four times a day (QID) | RECTAL | Status: DC | PRN

## 2023-01-18 MED ORDER — SODIUM CHLORIDE 0.9 % IV SOLN
1.0000 g | INTRAVENOUS | Status: DC
  Administered 2023-01-19: 1 g via INTRAVENOUS
  Filled 2023-01-18: qty 10

## 2023-01-18 MED ORDER — VITAMIN B-12 100 MCG PO TABS
100.0000 ug | ORAL_TABLET | Freq: Every day | ORAL | Status: DC
  Administered 2023-01-19 – 2023-01-21 (×2): 100 ug via ORAL
  Filled 2023-01-18 (×3): qty 1

## 2023-01-18 MED ORDER — IPRATROPIUM-ALBUTEROL 0.5-2.5 (3) MG/3ML IN SOLN: 3.0000 mL | Freq: Four times a day (QID) | RESPIRATORY_TRACT | Status: DC | PRN

## 2023-01-18 MED ORDER — DIVALPROEX SODIUM 125 MG PO CSDR
125.0000 mg | DELAYED_RELEASE_CAPSULE | Freq: Two times a day (BID) | ORAL | Status: DC
  Administered 2023-01-18 – 2023-01-19 (×4): 125 mg via ORAL
  Filled 2023-01-18 (×6): qty 1

## 2023-01-18 MED ORDER — HALOPERIDOL LACTATE 5 MG/ML IJ SOLN
2.5000 mg | Freq: Four times a day (QID) | INTRAMUSCULAR | Status: DC | PRN
  Administered 2023-01-18 – 2023-01-20 (×5): 2.5 mg via INTRAVENOUS
  Filled 2023-01-18: qty 0.5
  Filled 2023-01-18: qty 1
  Filled 2023-01-18 (×2): qty 0.5
  Filled 2023-01-18: qty 1
  Filled 2023-01-18 (×4): qty 0.5

## 2023-01-18 MED ORDER — BUSPIRONE HCL 5 MG PO TABS: 7.5000 mg | ORAL_TABLET | Freq: Every day | ORAL | Status: DC

## 2023-01-18 MED ORDER — PANTOPRAZOLE SODIUM 40 MG PO TBEC
40.0000 mg | DELAYED_RELEASE_TABLET | Freq: Every day | ORAL | Status: DC
  Administered 2023-01-18 – 2023-01-21 (×3): 40 mg via ORAL
  Filled 2023-01-18 (×4): qty 1

## 2023-01-18 MED ORDER — QUETIAPINE FUMARATE 100 MG PO TABS
50.0000 mg | ORAL_TABLET | Freq: Two times a day (BID) | ORAL | Status: DC
  Administered 2023-01-18 – 2023-01-19 (×4): 50 mg via ORAL
  Filled 2023-01-18 (×5): qty 1

## 2023-01-18 MED ORDER — SIMVASTATIN 10 MG PO TABS
10.0000 mg | ORAL_TABLET | Freq: Every day | ORAL | Status: DC
  Administered 2023-01-18 – 2023-01-20 (×3): 10 mg via ORAL
  Filled 2023-01-18 (×3): qty 1

## 2023-01-18 MED ORDER — NITROGLYCERIN 0.4 MG SL SUBL: 0.4000 mg | SUBLINGUAL_TABLET | SUBLINGUAL | Status: DC | PRN

## 2023-01-18 MED ORDER — BUSPIRONE HCL 5 MG PO TABS
10.0000 mg | ORAL_TABLET | Freq: Two times a day (BID) | ORAL | Status: DC
  Administered 2023-01-18 – 2023-01-19 (×4): 10 mg via ORAL
  Filled 2023-01-18 (×5): qty 2

## 2023-01-18 MED ORDER — SODIUM CHLORIDE 0.9 % IV SOLN
1.0000 g | Freq: Once | INTRAVENOUS | Status: AC
  Administered 2023-01-18: 1 g via INTRAVENOUS
  Filled 2023-01-18: qty 10

## 2023-01-18 MED ORDER — SODIUM CHLORIDE 0.9 % IV SOLN
500.0000 mg | Freq: Once | INTRAVENOUS | Status: AC
  Administered 2023-01-18: 500 mg via INTRAVENOUS
  Filled 2023-01-18: qty 5

## 2023-01-18 MED ORDER — ACETAMINOPHEN 325 MG PO TABS: 650.0000 mg | ORAL_TABLET | Freq: Four times a day (QID) | ORAL | Status: DC | PRN

## 2023-01-18 MED ORDER — FINASTERIDE 5 MG PO TABS
5.0000 mg | ORAL_TABLET | Freq: Every day | ORAL | Status: DC
  Administered 2023-01-18 – 2023-01-21 (×3): 5 mg via ORAL
  Filled 2023-01-18 (×4): qty 1

## 2023-01-18 MED ORDER — SODIUM CHLORIDE 0.9 % IV SOLN
500.0000 mg | INTRAVENOUS | Status: DC
  Administered 2023-01-19: 500 mg via INTRAVENOUS
  Filled 2023-01-18: qty 5

## 2023-01-18 NOTE — ED Triage Notes (Addendum)
BIBA from Memory Care Unit at [facility]. Pt had episode of aggressive behavior and combative at facility. EMS reports that pt had initially refused to get on their stretcher but they were able to convince him to after about 10 minutes. Pt has been calm and cooperative for EMS. Pt has hx of dementia and is at baseline per EMS. Pt also has hx of PTSD. Pt's POA wanted pt to be evaluated in ED for behavior. Pt is alert and oriented to person upon arrival, which is baseline for him per [place] staff. Pt denies complaint upon arrival.

## 2023-01-18 NOTE — Progress Notes (Signed)
Carryover admission to the Day Admitter.  I discussed this case with the EDP, Dr. Deno Etienne.  Per these discussions:  This is a 89 year old male with dementia, who was recently admitted to memory care approximately 1 month ago, who is being admitted with suspected acute metabolic encephalopathy superimposed on his baseline dementia, in the setting of suspected community-acquired pneumonia.  The staff at the patient's memory care facility have noted him to be more confused and aggressive over the last few days relative to his baseline mental status, prompting the patient to be brought to [facility], ED for evaluation of any potential medical contribution.  Patient is reportedly confused, and not able to contribute significantly to the history.  Chest x-ray reportedly shows evidence of left lower lobe infiltrate concerning for pneumonia, for which she was started on azithromycin and Rocephin for CAP coverage.  Urinalysis showed no evidence of urinary tract infection.  I have placed an order for inpatient admission to med-surg for further evaluation and management of the above  I have placed some additional preliminary admit orders via the adult multi-morbid admission order set. I have also ordered an add-on procalcitonin level.  I will defer additional decision making regarding subsequent antibiotics to the admitting hospitalist.    Babs Bertin, DO Hospitalist
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
## 2023-01-18 NOTE — Plan of Care (Signed)
  Problem: Health Behavior/Discharge Planning: Goal: Ability to manage health-related needs will improve Outcome: Progressing   Problem: Coping: Goal: Level of anxiety will decrease Outcome: Progressing   Problem: Safety: Goal: Ability to remain free from injury will improve Outcome: Progressing   Discussed plan of care and goals with patient's daughter, Rollene Fare, who has no further questions. Patient is confused.

## 2023-01-19 ENCOUNTER — Other Ambulatory Visit: Payer: Self-pay

## 2023-01-19 DIAGNOSIS — G9341 Metabolic encephalopathy: Secondary | ICD-10-CM | POA: Diagnosis not present

## 2023-01-19 LAB — STREP PNEUMONIAE URINARY ANTIGEN: Strep Pneumo Urinary Antigen: NEGATIVE

## 2023-01-19 MED ORDER — SODIUM CHLORIDE 0.9 % IV SOLN: 2.0000 g | INTRAVENOUS | Status: DC

## 2023-01-19 MED ORDER — AMOXICILLIN-POT CLAVULANATE 875-125 MG PO TABS
1.0000 | ORAL_TABLET | Freq: Two times a day (BID) | ORAL | Status: DC
  Administered 2023-01-19 – 2023-01-21 (×4): 1 via ORAL
  Filled 2023-01-19 (×5): qty 1

## 2023-01-19 NOTE — Progress Notes (Signed)
Patient restless in and out of bed. Not safe to get up independently. No sitter available, having to sit with patient and redirect. Patient disoriented at baseline, rambling speech. Currently able to redirect but patient continues to get out of bed.

## 2023-01-19 NOTE — Plan of Care (Signed)
  Problem: Safety: Goal: Ability to remain free from injury will improve Outcome: Progressing   

## 2023-01-19 NOTE — Plan of Care (Signed)
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
## 2023-01-20 DIAGNOSIS — F0390 Unspecified dementia without behavioral disturbance: Secondary | ICD-10-CM

## 2023-01-20 DIAGNOSIS — F4310 Post-traumatic stress disorder, unspecified: Secondary | ICD-10-CM

## 2023-01-20 DIAGNOSIS — G9341 Metabolic encephalopathy: Secondary | ICD-10-CM | POA: Diagnosis not present

## 2023-01-20 LAB — VITAMIN B12: Vitamin B-12: 596 pg/mL (ref 180–914)

## 2023-01-20 LAB — TSH: TSH: 2.074 u[IU]/mL (ref 0.350–4.500)

## 2023-01-20 LAB — FOLATE: Folate: 11.9 ng/mL (ref >5.9)

## 2023-01-20 MED ORDER — HALOPERIDOL LACTATE 5 MG/ML IJ SOLN
2.5000 mg | INTRAMUSCULAR | Status: AC
  Administered 2023-01-20: 2.5 mg via INTRAVENOUS
  Filled 2023-01-20: qty 0.5

## 2023-01-20 MED ORDER — OLANZAPINE 10 MG IM SOLR: 2.5000 mg | Freq: Every day | INTRAMUSCULAR | Status: DC | PRN

## 2023-01-20 MED ORDER — OLANZAPINE 10 MG IM SOLR: 2.5000 mg | Freq: Once | INTRAMUSCULAR | Status: DC

## 2023-01-20 MED ORDER — OLANZAPINE 5 MG PO TABS
2.5000 mg | ORAL_TABLET | Freq: Every day | ORAL | Status: AC
  Administered 2023-01-20: 2.5 mg via ORAL
  Filled 2023-01-20: qty 1

## 2023-01-20 MED ORDER — MELATONIN 5 MG PO TABS
5.0000 mg | ORAL_TABLET | Freq: Every evening | ORAL | Status: AC | PRN
  Administered 2023-01-20: 5 mg via ORAL
  Filled 2023-01-20: qty 1

## 2023-01-20 MED ORDER — DIVALPROEX SODIUM 125 MG PO CSDR
250.0000 mg | DELAYED_RELEASE_CAPSULE | Freq: Two times a day (BID) | ORAL | Status: DC
  Administered 2023-01-20 – 2023-01-21 (×2): 250 mg via ORAL
  Filled 2023-01-20 (×2): qty 2

## 2023-01-20 MED ORDER — OLANZAPINE 10 MG IM SOLR
2.5000 mg | INTRAMUSCULAR | Status: AC
  Administered 2023-01-20: 2.5 mg via INTRAMUSCULAR
  Filled 2023-01-20: qty 10

## 2023-01-20 MED ORDER — OLANZAPINE 10 MG IM SOLR
2.5000 mg | Freq: Once | INTRAMUSCULAR | Status: AC
  Administered 2023-01-20: 2.5 mg via INTRAMUSCULAR
  Filled 2023-01-20: qty 10

## 2023-01-20 MED ORDER — RAMELTEON 8 MG PO TABS
8.0000 mg | ORAL_TABLET | Freq: Every day | ORAL | Status: DC
  Administered 2023-01-20: 8 mg via ORAL
  Filled 2023-01-20: qty 1

## 2023-01-20 MED ORDER — OLANZAPINE 5 MG PO TABS: 2.5000 mg | ORAL_TABLET | Freq: Every day | ORAL | Status: DC | PRN
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
## 2023-01-20 NOTE — Progress Notes (Signed)
Patient's daughter, Rollene Fare, notified that patient has received multiple doses of medications but patient continues to be restless and gets out of bed, high fall risk and unsafe to ambulate independently. Daughter also notified that patient now in bilateral wrist and soft belt restraints for safety.  No further questions from daughter. Will continue monitoring.

## 2023-01-20 NOTE — Progress Notes (Addendum)
Patient up out of bed, walking on the halls. Refusing to return to bed. Security, [employer] and [employer] notified.   [place] at bedside attempting to redirect patient.

## 2023-01-20 NOTE — Progress Notes (Signed)
Patient slid out of soft belt. Out of bed, unable to redirect. Provider made aware. Security at bedside.

## 2023-01-20 NOTE — Plan of Care (Signed)
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
## 2023-01-20 NOTE — Progress Notes (Signed)
Patient out of bed, walking in hallways. Restless and getting agitated when re-directed. As primary RN, unable to leave patient's bedside due to safety concerns and patient not staying in bed, no safety sitters available. AC and security called to bedside. Zebedee Iba NP made aware, see new orders.   Zyprexa given, patient back to bed.  12-lead EKG performed per provider orders.  Will continue to monitor.

## 2023-01-20 NOTE — Progress Notes (Addendum)
       CROSS COVER NOTE  Name: John Sosa  DOB: January 24, 1929  EQA:[id number]  DOA: 01/18/2023  Level of care: Med-Surg    Date of Service   01/20/2023     HPI/Events of Note   0115- Notified by nursing staff that patient is agitated, restless, and confused.  Noncombative at this time. RN reports no signs of distress, pain, or urinary retention that could be contributing to increased agitation.   RN reports that patient has gotten out of bed multiple times without assistance.  Patient has also been wandering out in the halls. High fall risk. Safety sitter has been recommended, however not available at this time. Unfortunately, patient only intermittently follows commands despite multiple attempts at redirection by staff, therefore not a candidate for TeleSitter.  Patient has already received oral Seroquel and IV Haldol.  Additional Haldol was provided earlier as well.  Despite those interventions, RN reports no improvement in the patient's agitation and restlessness.  Will trial IM Zyprexa.  QTc 431, stable. Melatonin has also been ordered to assist with sleep cycle.    RN to continue monitoring.     0345- Due to patient's continued agitation, restlessness, and inability to be redirected, there is concern for his safety. Restraints have been placed. Restraints to be discontinued as soon as patient is able to safely have them removed. Family has been updated by RN, daughter will be coming in this morning to sit with him.    Interventions/ Plan   IV Haldol IM Zyprexa Melatonin Restraints        Raenette Rover, DNP, [place]

## 2023-01-21 DIAGNOSIS — G9341 Metabolic encephalopathy: Secondary | ICD-10-CM | POA: Diagnosis not present

## 2023-01-21 LAB — RPR: RPR Ser Ql: NONREACTIVE

## 2023-01-21 MED ORDER — DIVALPROEX SODIUM 125 MG PO CSDR: 250.0000 mg | DELAYED_RELEASE_CAPSULE | Freq: Two times a day (BID) | ORAL | 0 refills | Status: AC

## 2023-01-21 MED ORDER — RAMELTEON 8 MG PO TABS: 8.0000 mg | ORAL_TABLET | Freq: Every day | ORAL | 0 refills | Status: AC

## 2023-01-21 MED ORDER — AMOXICILLIN-POT CLAVULANATE 875-125 MG PO TABS: 1.0000 | ORAL_TABLET | Freq: Two times a day (BID) | ORAL | 0 refills | Status: AC

## 2023-01-21 MED ORDER — OLANZAPINE 2.5 MG PO TABS: 2.5000 mg | ORAL_TABLET | Freq: Every day | ORAL | 2 refills | Status: AC

## 2023-01-21 MED ORDER — OLANZAPINE 2.5 MG PO TABS: 2.5000 mg | ORAL_TABLET | Freq: Every day | ORAL | 0 refills | Status: AC | PRN

## 2023-01-21 NOTE — Plan of Care (Signed)

## 2023-01-21 NOTE — [place] FL2 (Signed)
[place] MEDICAID FL2 LEVEL OF CARE FORM     IDENTIFICATION  Patient Name: John Sosa Birthdate: [birth year] Sex: male Admission Date (Current Location): 01/18/2023  [facility] and [place] Number:  Herbalist and Address:  [facility],  [place], [place]      Provider Number: [id number]  Attending Physician Name and Address:  Shelly Coss, MD  Relative Name and Phone Number:  Cassell Smiles daughter [phone]    Current Level of Care: Hospital Recommended Level of Care: Memory Care Prior Approval Number:    Date Approved/Denied:   PASRR Number:    Discharge Plan: Other (Comment) (memory care)    Current Diagnoses: Patient Active Problem List   Diagnosis Date Noted   Acute metabolic encephalopathy [id number]   Hyperlipidemia 01/18/2023   Hearing loss 01/18/2023   Normocytic anemia 01/18/2023   Aortic atherosclerosis ([place]) 01/18/2023   PTSD (post-traumatic stress disorder) 01/18/2023   Dementia ([place]) 01/18/2023   Left lower lobe pulmonary infiltrate 01/18/2023   Nasal turbinate hypertrophy 04/10/2018   Rhinitis medicamentosa 04/10/2018   Chest pain 07/13/2015   GERD (gastroesophageal reflux disease) 07/13/2015   Benign prostatic hyperplasia 07/13/2015   Sinus bradycardia on ECG 07/13/2015   Hyperlipemia     Orientation RESPIRATION BLADDER Height & Weight     Self  Normal Continent Weight: 58.8 kg Height:  '5\' 6"'$  (167.6 cm)  BEHAVIORAL SYMPTOMS/MOOD NEUROLOGICAL BOWEL NUTRITION STATUS  Wanderer, Other (Comment) (forgetful)  (n/a) Continent Diet  AMBULATORY STATUS COMMUNICATION OF NEEDS Skin   Limited Assist Verbally Normal                       Personal Care Assistance Level of Assistance  Bathing, Feeding, Dressing, Total care Bathing Assistance: Limited assistance Feeding assistance: Limited assistance Dressing Assistance: Limited assistance Total Care Assistance: Limited assistance   Functional  Limitations Info  Sight, Hearing, Speech Sight Info: Adequate Hearing Info: Adequate Speech Info: Adequate    SPECIAL CARE FACTORS FREQUENCY  PT (By licensed PT), OT (By licensed OT)     PT Frequency: 3x/wk OT Frequency: 3x/wk            Contractures Contractures Info: Not present    Additional Factors Info  Code Status, Allergies, Psychotropic, Insulin Sliding Scale, Isolation Precautions, Suctioning Needs Code Status Info: DNR Allergies Info: No known drug allergies Psychotropic Info: see discharge summary Insulin Sliding Scale Info: see discharge summary Isolation Precautions Info: n/a Suctioning Needs: n/a   Current Medications (01/21/2023):  This is the current hospital active medication list Medication List       STOP taking these medications     busPIRone 7.5 MG tablet Commonly known as: BUSPAR    melatonin 5 MG Tabs    QUEtiapine 25 MG tablet Commonly known as: SEROQUEL    QUEtiapine 50 MG tablet Commonly known as: SEROQUEL           TAKE these medications     amoxicillin-clavulanate 875-125 MG tablet Commonly known as: AUGMENTIN Take 1 tablet by mouth every 12 (twelve) hours for 2 days.    cyanocobalamin 1000 MCG tablet Commonly known as: VITAMIN B12 Take 1,000 mcg by mouth daily.    divalproex 125 MG capsule Commonly known as: DEPAKOTE SPRINKLE Take 2 capsules (250 mg total) by mouth 2 (two) times daily.    finasteride 5 MG tablet Commonly known as: PROSCAR Take 5 mg by mouth daily.    hydrOXYzine 10 MG tablet Commonly  known as: ATARAX Take 10 mg by mouth every 8 (eight) hours as needed for anxiety (agitation).    nitroGLYCERIN 0.4 MG SL tablet Commonly known as: NITROSTAT Place 1 tablet (0.4 mg total) under the tongue every 5 (five) minutes as needed for chest pain.    OLANZapine 2.5 MG tablet Commonly known as: ZYPREXA Take 1 tablet (2.5 mg total) by mouth daily as needed (agitation).    OLANZapine 2.5 MG tablet Commonly known  as: ZyPREXA Take 1 tablet (2.5 mg total) by mouth at bedtime.    omeprazole 40 MG capsule Commonly known as: PRILOSEC Take 1 capsule (40 mg total) by mouth daily.    PRESERVISION AREDS 2 PO Take 1 capsule by mouth daily.    ramelteon 8 MG tablet Commonly known as: ROZEREM Take 1 tablet (8 mg total) by mouth at bedtime.    simvastatin 10 MG tablet Commonly known as: ZOCOR Take 1 tablet by mouth daily.    Vitamin D (Ergocalciferol) 1.25 MG (50000 UNIT) Caps capsule Commonly known as: DRISDOL Take 50,000 Units by mouth once a week. For 8 weeks starting on 12/03/22    Vitamin D3 50 MCG (2000 UT) Tabs Generic drug: Cholecalciferol Take 2,000 Units by mouth daily.     Discharge Medications: Please see discharge summary for a list of discharge medications.  Relevant Imaging Results:  Relevant Lab Results:   Additional Information SS# [id number]  Angelita Ingles, RN

## 2023-01-21 NOTE — Discharge Instructions (Signed)
Please be aware of multiple medication changes.  Increased Valproic Acid on 01/20/2023. Will need new Valproic acid level on 01/24/23. Start Depakote sprinkles 250 mg Po BID.  STOP Seroquel and start Zyprexa 2.'5mg'$  po QHS Continue Vistaril prn for agitation. >>>> If ineffective may give PRN olanzapine 2.'5mg'$  once daily prn.  Start Rozerem([place]) '8mg'$  po qhs.  Stop Buspar.   The following rx have been sent to RX care, please update MAR with these changes.

## 2023-01-21 NOTE — TOC Progression Note (Addendum)
Transition of Care [facility]) - Progression Note    Patient Details  Name: DRAEDEN VIELE MRN: [id number] Date of Birth: January 04, 1929  [facility] CM/SW [place], RN Phone [phone]  01/21/2023, 11:40 AM  Clinical Narrative:    CM received message from MD stating that patient will be discharged back memory care today. CM spoke with admissions coordinator who has confirmed that patient comes from [facility] memory care. Admissions coordinator states that patient is able to return and that facility will need updated FL2 , hard copy scripts for any new medications and a discharge summary. MD has been made aware that facility will need hard copy of scripts. FL2 pending awaiting d/c summary to update medication list.   [id number] CM has updated FL2 and discharge summary in hand. Attempted to reach admissions coordinator at [facility] memory care with no answer. Voicemail has been left requesting fax number. Will follow up again within about 15 minutes.   1211 CM received return call from [employer]. Patient is ok to return. FL2 and discharge summary faxed to [phone].       Expected Discharge Plan and Services         Expected Discharge Date: 01/21/23                                     Social Determinants of Health (SDOH) Interventions SDOH Screenings   Food Insecurity: No Food Insecurity (01/19/2023)  Housing: Low Risk  (01/19/2023)  Transportation Needs: No Transportation Needs (01/19/2023)  Utilities: Not At Risk (01/19/2023)  Tobacco Use: Medium Risk (01/18/2023)    Readmission Risk Interventions     No data to display
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
## 2023-01-21 NOTE — Progress Notes (Signed)
Chaplain engaged in an initial visit with Crishawn and his two daughters, Rollene Fare and Katharine Look. Chaplain offered support around Scientist, physiological, healthcare POA. Chaplain recognized that Matsuo has advanced dementia and relayed to family that she would not be able to complete documents. Chaplain was able to offer some assurance that because they are "next-of-kin," [place] offers them decision making abilities. Sarith is married, but his wife also has significant health challenges. Their daughters are the primary decision makers. Chaplain continued to affirm their role.   Chaplain offered support, presence, listening, and prayer with them.   Chaplain Akeela Busk, MDiv  01/21/23 1200  Spiritual Encounters  Type of Visit Initial  Care provided to: Pt and family  Reason for visit Advance directives  Spiritual Framework  Presenting Themes Goals in life/care;Community and relationships;Rituals and practive  Community/Connection Family  Interventions  Spiritual Care Interventions Made Established relationship of care and support;Decision-making support/facilitation;Compassionate presence;Prayer  Intervention Outcomes  Outcomes Awareness of support;Connection to spiritual care

## 2023-01-21 NOTE — TOC Transition Note (Signed)
Transition of Care [facility]) - CM/SW Discharge Note   Patient Details  Name: John Sosa MRN: [id number] Date of Birth: [birth year]  Transition [facility]) CM/SW Contact:  Angelita Ingles, RN Phone [phone]  01/21/2023, 12:59 PM   Clinical Narrative:    Patient ok to discharge back to [facility]. No other needs noted. TOC will sign off          Patient Goals and CMS Choice      Discharge Placement                         Discharge Plan and Services Additional resources added to the After Visit Summary for                                       Social Determinants of Health (SDOH) Interventions SDOH Screenings   Food Insecurity: No Food Insecurity (01/19/2023)  Housing: Low Risk  (01/19/2023)  Transportation Needs: No Transportation Needs (01/19/2023)  Utilities: Not At Risk (01/19/2023)  Tobacco Use: Medium Risk (01/18/2023)     Readmission Risk Interventions     No data to display

## 2023-01-23 LAB — CULTURE, BLOOD (ROUTINE X 2)
Culture: NO GROWTH
Culture: NO GROWTH

## 2023-02-02 ENCOUNTER — Emergency Department (HOSPITAL_COMMUNITY)
Admission: EM | Admit: 2023-02-02 | Discharge: 2023-02-02 | Disposition: A | Discharge status: Skilled Nursing Facility | Payer: Medicare Other | Attending: Emergency Medicine | Admitting: Emergency Medicine

## 2023-02-02 ENCOUNTER — Emergency Department (HOSPITAL_COMMUNITY): Payer: Medicare Other

## 2023-02-02 ENCOUNTER — Other Ambulatory Visit: Payer: Self-pay

## 2023-02-02 DIAGNOSIS — R451 Restlessness and agitation: Secondary | ICD-10-CM | POA: Diagnosis present

## 2023-02-02 DIAGNOSIS — F0390 Unspecified dementia without behavioral disturbance: Secondary | ICD-10-CM | POA: Diagnosis not present

## 2023-02-02 LAB — URINALYSIS, ROUTINE W REFLEX MICROSCOPIC
Bilirubin Urine: NEGATIVE
Glucose, UA: NEGATIVE mg/dL
Hgb urine dipstick: NEGATIVE
Ketones, ur: 5 mg/dL — AB
Leukocytes,Ua: NEGATIVE
Nitrite: NEGATIVE
Protein, ur: NEGATIVE mg/dL
Specific Gravity, Urine: 1.016 (ref 1.005–1.030)
pH: 5 (ref 5.0–8.0)

## 2023-02-02 LAB — COMPREHENSIVE METABOLIC PANEL
ALT: 20 U/L (ref 0–44)
AST: 23 U/L (ref 15–41)
Albumin: 3.1 g/dL — ABNORMAL LOW (ref 3.5–5.0)
Alkaline Phosphatase: 63 U/L (ref 38–126)
Anion gap: 7 (ref 5–15)
BUN: 20 mg/dL (ref 8–23)
CO2: 24 mmol/L (ref 22–32)
Calcium: 8.2 mg/dL — ABNORMAL LOW (ref 8.9–10.3)
Chloride: 105 mmol/L (ref 98–111)
Creatinine, Ser: 0.85 mg/dL (ref 0.61–1.24)
GFR, Estimated: >60 mL/min (ref >60)
Glucose, Bld: 104 mg/dL — ABNORMAL HIGH (ref 70–99)
Potassium: 4 mmol/L (ref 3.5–5.1)
Sodium: 136 mmol/L (ref 135–145)
Total Bilirubin: 0.5 mg/dL (ref 0.3–1.2)
Total Protein: 6.7 g/dL (ref 6.5–8.1)

## 2023-02-02 LAB — CBC WITH DIFFERENTIAL/PLATELET
Abs Immature Granulocytes: 0.04 10*3/uL (ref 0.00–0.07)
Basophils Absolute: 0 10*3/uL (ref 0.0–0.1)
Basophils Relative: 1 %
Eosinophils Absolute: 0.3 10*3/uL (ref 0.0–0.5)
Eosinophils Relative: 6 %
HCT: 35.3 % — ABNORMAL LOW (ref 39.0–52.0)
Hemoglobin: 11.3 g/dL — ABNORMAL LOW (ref 13.0–17.0)
Immature Granulocytes: 1 %
Lymphocytes Relative: 18 %
Lymphs Abs: 0.9 10*3/uL (ref 0.7–4.0)
MCH: 30.7 pg (ref 26.0–34.0)
MCHC: 32 g/dL (ref 30.0–36.0)
MCV: 95.9 fL (ref 80.0–100.0)
Monocytes Absolute: 0.4 10*3/uL (ref 0.1–1.0)
Monocytes Relative: 8 %
Neutro Abs: 3.6 10*3/uL (ref 1.7–7.7)
Neutrophils Relative %: 66 %
Platelets: 150 10*3/uL (ref 150–400)
RBC: 3.68 MIL/uL — ABNORMAL LOW (ref 4.22–5.81)
RDW: 12.8 % (ref 11.5–15.5)
WBC: 5.4 10*3/uL (ref 4.0–10.5)
nRBC: 0 % (ref 0.0–0.2)

## 2023-02-02 LAB — VALPROIC ACID LEVEL: Valproic Acid Lvl: 34 ug/mL — ABNORMAL LOW (ref 50.0–100.0)

## 2023-02-02 MED ORDER — OLANZAPINE 5 MG PO TABS
5.0000 mg | ORAL_TABLET | Freq: Every day | ORAL | Status: DC
  Administered 2023-02-02: 5 mg via ORAL
  Filled 2023-02-02: qty 1

## 2023-02-02 MED ORDER — LACTATED RINGERS IV SOLN: INTRAVENOUS | Status: DC
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
## 2023-02-02 NOTE — ED Notes (Signed)
Daughter at bedside.
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
## 2023-02-02 NOTE — ED Notes (Signed)
Patient reports no current complaints.  Daughters at bedside and will be transporting patient back to [facility].

## 2023-02-13 ENCOUNTER — Ambulatory Visit (HOSPITAL_COMMUNITY)
Admission: EM | Admit: 2023-02-13 | Discharge: 2023-02-13 | Disposition: A | Discharge status: Home / Self Care | Attending: Family | Admitting: Family

## 2023-02-13 DIAGNOSIS — F03918 Unspecified dementia, unspecified severity, with other behavioral disturbance: Secondary | ICD-10-CM | POA: Diagnosis present
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
## 2023-02-13 NOTE — Progress Notes (Signed)
   02/13/23 1841  [facility] Triage Screening (Walk-ins at [facility] only)  How Did You Hear About [place]? Family/Friend  What Is the Reason for Your Visit/Call Today? Patient presents with his two daughters from [facility] for assessment.  Patient is only oriented to self and mostly rambles about various things.  He and his wife moved to [facility] assisted living several months ago.  Both experienced progressive worsenig of dimentia symptoms.  Patient's wife is now staying with patient's daughter until a spot opens at the [facility].  Due to patient's aggression, he would not be considered at this facility.  He has since moved into a locked memory care unit at [facility].  Apparently, he is requiring a 1:1, however this is not available, so they have instructed the family to hire a Actuary.  They have done this, however feel this is a service the facility should provide.  Patient has become more aggressive and demanding since he and wife were separated.  It appears he is attaching to other females, especially one resident.  Apparently, Rollene Fare observed him chasing this lady and called police.  When they didnt' feel comfortable transporting patient, they decided to bring patient in for assessment, hoping he would be placed in another facility from here.  There are also issues with medication administration and patient's daughter, Regina(who is an Therapist, sports), states there are only med techs and there have been dosing errors.  Patient was recently evaluated by Dr. Grayling Congress at [facility] when he was admitted for pnumonia.  She spoke with patient's family and encouraged them to continue to seek placement, as inpatient treatment would not be helpful.  She made medication adjustments, adding a PRN medication.  Patient's daughters have spoken with Claudette Head of [facility], who will be meeting patient tomorrow at 75.  They plan to attend this meeting, in hopes patient would be considered for their  facility.  They are concerned, as Claudette Head stated ED referrals to take precedence.  There are no imminent safety issues at this time, outside of symptoms associated with worsening dimentia.  How Long Has This Been Causing You Problems? > than 6 months  Have You Recently Had Any Thoughts About Hurting Yourself? No  Are You Planning to Commit Suicide/Harm Yourself At This time? No  Have you Recently Had Thoughts About [place]? No  Are You Planning To Harm Someone At This Time? No  Are you currently experiencing any auditory, visual or other hallucinations? No  Have You Used Any Alcohol or Drugs in the Past 24 Hours? No  Do you have any current medical co-morbidities that require immediate attention? No  Clinician description of patient physical appearance/behavior: Patient is calm, cooperative, often rambling about various topics.  Speech is indeciperable mostly.  He is only oriented to self.  What Do You Feel Would Help You the Most Today? Social Support  If access to [facility] was not available, would you have sought care in the Emergency Department? No  Determination of Need Routine (7 days)  Options For Referral ALF/SNF

## 2023-02-14 NOTE — Discharge Instructions (Addendum)
Patient is instructed prior to discharge to:  Take all medications as prescribed by his/her mental healthcare provider. Report any adverse effects and or reactions from the medicines to his/her outpatient provider promptly. Keep all scheduled appointments, to ensure that you are getting refills on time and to avoid any interruption in your medication.  If you are unable to keep an appointment call to reschedule.  Be sure to follow-up with resources and follow-up appointments provided.  Patient has been instructed & cautioned: To not engage in alcohol and or illegal drug use while on prescription medicines. In the event of worsening symptoms, patient is instructed to call the crisis hotline, 911 and or go to the nearest ED for appropriate evaluation and treatment of symptoms. To follow-up with his/her primary care provider for your other medical issues, concerns and or health care needs.  Information: -National Suicide Prevention Lifeline 1-800-SUICIDE or 2536790409.  -988 offers 24/7 access to trained crisis counselors who can help people experiencing mental health-related distress. People can call or text 988 or chat 988lifeline.org for themselves or if they are worried about a loved one who may need crisis support.     Contact information provided for Ombudsman to patient's daughters.

## 2023-02-19 ENCOUNTER — Encounter (HOSPITAL_COMMUNITY): Payer: Self-pay | Admitting: Psychiatry

## 2023-02-19 ENCOUNTER — Ambulatory Visit (HOSPITAL_BASED_OUTPATIENT_CLINIC_OR_DEPARTMENT_OTHER): Admitting: Psychiatry

## 2023-02-19 VITALS — BP 109/71 | HR 62 | Ht 66.0 in | Wt 125.0 lb

## 2023-02-19 DIAGNOSIS — F03918 Unspecified dementia, unspecified severity, with other behavioral disturbance: Secondary | ICD-10-CM | POA: Diagnosis not present

## 2023-02-19 NOTE — Progress Notes (Signed)
Psychiatric Initial Adult Assessment   Patient Identification: John Sosa MRN:  [id number] Date of Evaluation:  02/19/2023 Referral Source: Dr. Gasper Sells  Chief Complaint:   Chief Complaint  Patient presents with  . New Patient (Initial Visit)   Visit Diagnosis: Dementia with behavioral disturbance  History of Present Illness:    Today this patient was seen with his daughter who is the power of attorney's name is Fannie Knee.  The patient is a 89 year old gentleman who experienced an episode of delirium and was hospitalized for pneumonia.  There are a number of processes and through a good consultation the patient's medications were adjusted and now he takes Depakote and Zyprexa and is doing very well.  His m.Ez. is going to be moving from one facility to a new 1 in the next few days.  He is moving to [facility] which has a memory care.  The patient is sleeping fairly well.  He is eating fair.  His memory impaired and is hearing impaired.  According to his daughter he experiences voices but is pleasant voices of his parents talking to him.  At this time he is very medically stable.  He has a history of being violent and aggressive at times.  The family is interested in having him assessed just to give another opinion.  At this time I shared with him that changes in his medications would be an appropriate and they agreed.  I think he has to get adjusted to the place he is in.  Further I recommended that the best type of psychiatric care for this 89 year old man who is going to be in a facility is to get psychiatric services that come 1 site to where he is living.  Apparently the new facility does have psychiatric services and my recommendation to them was to activate them and get them involved right away.  The regime that he is taking right now is very appropriate and he seems actually doing quite well.  His daughter says he is compliant with care cooperative and really rarely agitated.  Associated  Signs/Symptoms: Depression Symptoms:   (Hypo) Manic Symptoms:   Anxiety Symptoms:   Psychotic Symptoms:  Hallucinations: Auditory PTSD Symptoms:   Past Psychiatric History:   Previous Psychotropic Medications: Yes   Substance Abuse History in the last 12 months:  No.  Consequences of Substance Abuse: Negative  Past Medical History:  Past Medical History:  Diagnosis Date  . Aortic atherosclerosis 01/18/2023  . Benign prostatic hyperplasia 07/13/2015  . Dementia 01/18/2023  . GERD (gastroesophageal reflux disease)   . Hyperlipemia   . Normocytic anemia 01/18/2023  . PTSD (post-traumatic stress disorder) 01/18/2023    Past Surgical History:  Procedure Laterality Date  . HEMORRHOID SURGERY  1950    Family Psychiatric History:   Family History:  Family History  Problem Relation Age of Onset  . Diabetes Brother     Social History:   Social History   Socioeconomic History  . Marital status: Married    Spouse name: Not on file  . Number of children: 3  . Years of education: Not on file  . Highest education level: Not on file  Occupational History  . Occupation: Retired-Textile  Tobacco Use  . Smoking status: Former    Types: Cigarettes    Quit date: 11/20/1956    Years since quitting: 66.2  . Smokeless tobacco: Never  Substance and Sexual Activity  . Alcohol use: No    Alcohol/week: 0.0 standard drinks of alcohol  .  Drug use: No  . Sexual activity: Not on file  Other Topics Concern  . Not on file  Social History Narrative  . Not on file   Social Determinants of Health   Financial Resource Strain: Not on file  Food Insecurity: No Food Insecurity (01/19/2023)   Hunger Vital Sign   . Worried About Programme researcher, broadcasting/film/video in the Last Year: Never true   . Ran Out of Food in the Last Year: Never true  Transportation Needs: No Transportation Needs (01/19/2023)   PRAPARE - Transportation   . Lack of Transportation (Medical): No   . Lack of Transportation  (Non-Medical): No  Physical Activity: Not on file  Stress: Not on file  Social Connections: Not on file    Additional Social History:   Allergies:  No Known Allergies  Metabolic Disorder Labs: No results found for: "HGBA1C", "MPG" No results found for: "PROLACTIN" Lab Results  Component Value Date   CHOL 171 12/12/2019   TRIG 76 12/12/2019   HDL 45 12/12/2019   CHOLHDL 3.8 12/12/2019   VLDL 15 12/12/2019   LDLCALC 111 (H) 12/12/2019   Lab Results  Component Value Date   TSH 2.074 01/20/2023    Therapeutic Level Labs: No results found for: "LITHIUM" No results found for: "CBMZ" Lab Results  Component Value Date   VALPROATE 34 (L) 02/02/2023    Current Medications: Current Outpatient Medications  Medication Sig Dispense Refill  . Cholecalciferol (VITAMIN D3) 50 MCG (2000 UT) TABS Take 2,000 Units by mouth daily.    . cyanocobalamin (VITAMIN B12) 1000 MCG tablet Take 1,000 mcg by mouth daily.    . divalproex (DEPAKOTE SPRINKLE) 125 MG capsule Take 2 capsules (250 mg total) by mouth 2 (two) times daily. (Patient taking differently: Take 250 mg by mouth See admin instructions. Take 250 mg by mouth at 8 AM and 8 PM- may open and mix with food) 60 capsule 0  . finasteride (PROSCAR) 5 MG tablet Take 5 mg by mouth daily.    . hydrOXYzine (ATARAX) 10 MG tablet Take 10 mg by mouth every 8 (eight) hours as needed for anxiety (or agitation).    . Multiple Vitamins-Minerals (PRESERVISION AREDS 2 PO) Take 1 capsule by mouth daily.    Marland Kitchen OLANZapine (ZYPREXA) 2.5 MG tablet Take 1 tablet (2.5 mg total) by mouth daily as needed (agitation). 30 tablet 0  . OLANZapine (ZYPREXA) 2.5 MG tablet Take 1 tablet (2.5 mg total) by mouth at bedtime. 30 tablet 2  . OLANZapine (ZYPREXA) 5 MG tablet Take 5 mg by mouth at bedtime.    . ramelteon (ROZEREM) 8 MG tablet Take 1 tablet (8 mg total) by mouth at bedtime. 30 tablet 0  . nitroGLYCERIN (NITROSTAT) 0.4 MG SL tablet Place 1 tablet (0.4 mg total)  under the tongue every 5 (five) minutes as needed for chest pain. 25 tablet 3  . omeprazole (PRILOSEC) 40 MG capsule Take 1 capsule (40 mg total) by mouth daily. 30 capsule 0   No current facility-administered medications for this visit.    Musculoskeletal: Strength & Muscle Tone: decreased Gait & Station: unsteady Patient leans: Right  Psychiatric Specialty Exam: Review of Systems  Blood pressure 109/71, pulse 62, height 5\' 6"  (1.676 m), weight 125 lb (56.7 kg).Body mass index is 20.18 kg/m.  General Appearance: Casual  Eye Contact:  Absent  Speech:  Garbled  Volume:  Normal  Mood:  NA  Affect:  Non-Congruent  Thought Process:  Disorganized  Orientation:  Thought Content:  Negative  Suicidal Thoughts:  No  Homicidal Thoughts:  No  Memory:  NA  Judgement:  Poor  Insight:  Negative and NA  Psychomotor Activity:  Decreased  Concentration:    Recall:  Poor  Fund of Knowledge:Poor  Language: Poor  Akathisia:  No  Handed:  Right  AIMS (if indicated):  not done  Assets:  Desire for Improvement  ADL's:  Impaired  Cognition: Impaired,  Severe  Sleep:  Poor   Screenings: Flowsheet Row [facility] H[facility] (Discharged) [facility]  C-SSRS RISK CATEGORY No Risk No Risk No Risk       Assessment and Plan:   Technically this patient is diagnosed with dementia with behavioral disturbance.  Given that he has been violent is appropriately appropriate that he is on moderate dose of Zyprexa and Depakote.  The new psychiatric provider should probably obtain a Depakote level and adjust Zyprexa as necessary.  Given the fact the patient is going to a new facility and has to adjust to that setting I would suspect that some when she does gain some data over the next 1 to 2 months  before making any distinct changes.  Fortunately he is walking well not over sedated and has had no falls.  He does not appear angry at all.  He appears mildly sedated.  I suspect that his future psychiatric care should be obtained the new facility that he is moving to.  I made no changes in his medicines.  The medicine that he is taking now are very appropriate.  Collaboration of Care:   Patient/Guardian was advised Release of Information must be obtained prior to any record release in order to collaborate their care with an outside provider. Patient/Guardian was advised if they have not already done so to contact the registration department to sign all necessary forms in order for us to release information regarding their care.   Consent: Patient/Guardian gives verbal consent for treatment and assignment of benefits for services provided during this visit. Patient/Guardian expressed understanding and agreed to proceed.   Gypsy BalsamGerald I Abdoulaye Drum, MD 4/9/20243:31 PM

## 2023-03-13 DEATH — deceased
